# Patient Record
Sex: Male | Born: 1980 | Race: Black or African American | Hispanic: No | Marital: Married | Smoking: Former smoker
Health system: Southern US, Community
[De-identification: ages and names within clinical notes are randomized; demographics above are authoritative.]

## PROBLEM LIST (undated history)

## (undated) DIAGNOSIS — R51 Headache: Secondary | ICD-10-CM

## (undated) DIAGNOSIS — M109 Gout, unspecified: Secondary | ICD-10-CM

## (undated) DIAGNOSIS — K219 Gastro-esophageal reflux disease without esophagitis: Secondary | ICD-10-CM

## (undated) HISTORY — DX: Gastro-esophageal reflux disease without esophagitis: K21.9

## (undated) HISTORY — DX: Headache: R51

## (undated) HISTORY — DX: Gout, unspecified: M10.9

---

## 2011-11-29 ENCOUNTER — Encounter: Payer: Self-pay | Admitting: Family

## 2011-11-29 ENCOUNTER — Ambulatory Visit (INDEPENDENT_AMBULATORY_CARE_PROVIDER_SITE_OTHER): Payer: 59 | Admitting: Family

## 2011-11-29 DIAGNOSIS — R110 Nausea: Secondary | ICD-10-CM

## 2011-11-29 DIAGNOSIS — K219 Gastro-esophageal reflux disease without esophagitis: Secondary | ICD-10-CM

## 2011-11-29 DIAGNOSIS — S76119A Strain of unspecified quadriceps muscle, fascia and tendon, initial encounter: Secondary | ICD-10-CM

## 2011-11-29 DIAGNOSIS — S838X9A Sprain of other specified parts of unspecified knee, initial encounter: Secondary | ICD-10-CM

## 2011-11-29 LAB — CBC WITH DIFFERENTIAL/PLATELET
Basophils Absolute: 0 10*3/uL (ref 0.0–0.1)
Basophils Relative: 0.3 % (ref 0.0–3.0)
Eosinophils Absolute: 0.3 10*3/uL (ref 0.0–0.7)
Hemoglobin: 13.2 g/dL (ref 13.0–17.0)
Lymphocytes Relative: 25.1 % (ref 12.0–46.0)
MCHC: 32.3 g/dL (ref 30.0–36.0)
Monocytes Relative: 9.1 % (ref 3.0–12.0)
Neutro Abs: 5.6 10*3/uL (ref 1.4–7.7)
Neutrophils Relative %: 62.5 % (ref 43.0–77.0)
RBC: 4.48 Mil/uL (ref 4.22–5.81)
RDW: 13.7 % (ref 11.5–14.6)

## 2011-11-29 LAB — BASIC METABOLIC PANEL
Chloride: 106 mEq/L (ref 96–112)
GFR: 84.29 mL/min (ref >60.00)
Potassium: 4.7 mEq/L (ref 3.5–5.1)
Sodium: 141 mEq/L (ref 135–145)

## 2011-11-29 MED ORDER — DICLOFENAC SODIUM 75 MG PO TBEC: 75.0000 mg | DELAYED_RELEASE_TABLET | Freq: Two times a day (BID) | ORAL | Status: AC

## 2011-11-29 MED ORDER — CYCLOBENZAPRINE HCL 10 MG PO TABS: 10.0000 mg | ORAL_TABLET | Freq: Three times a day (TID) | ORAL | Status: DC | PRN

## 2011-11-29 NOTE — Progress Notes (Signed)
Subjective:    Patient ID: Alex Diaz, male    DOB: September 11, 1981, 31 y.o.   MRN: [id number]  HPI 31 year old [place] American male, new patient, nonsmoker exam to be established. Patient has a history of GERD that he typically controls with dietary changes. He is concerned that he typically develops nausea about 2 AM every night that last until he wakes up the next morning. He's been drinking water to help his symptoms and is unsure if it's really helping. He has not taken any medication for it. Has been off Toprol effect for several months. He denies any vomiting, diarrhea, constipation, blood in the stools or dark black stools.  Patient reports that he has recently began exercising more and believes that he has pulled a muscle in his left thigh. The pain has been present for about a week and a half, rates the pain a 9/10, worse with stretching. Describes it as a tight ache. Has been applying heat that he believes makes it worse. He has not tried any medication therapy. Denies any numbness or tingling.  Review of Systems  Constitutional: Negative.   HENT: Negative.   Eyes: Negative.   Respiratory: Negative.   Cardiovascular: Negative.   Gastrointestinal: Positive for nausea. Negative for vomiting, diarrhea, constipation and blood in stool.  Genitourinary: Negative.   Musculoskeletal: Positive for myalgias.       Left thigh  Skin: Negative.   Neurological: Negative.   Hematological: Negative.    Past Medical History  Diagnosis Date  . Headache   . GERD (gastroesophageal reflux disease)     History   Social History  . Marital Status: Single    Spouse Name: N/A    Number of Children: N/A  . Years of Education: N/A   Occupational History  . Not on file.   Social History Main Topics  . Smoking status: Former Games developer  . Smokeless tobacco: Not on file  . Alcohol Use: Yes     occassionally  . Drug Use: No  . Sexually Active:    Other Topics Concern  . Not on file    Social History Narrative  . No narrative on file    No past surgical history on file.  Family History  Problem Relation Age of Onset  . Hypertension Father   . Kidney disease Father   . Diabetes Paternal Aunt   . Cancer Maternal Grandmother     lung  . Diabetes Maternal Grandfather   . Heart disease Paternal Grandmother   . Stroke Paternal Grandmother     No Known Allergies  No current outpatient prescriptions on file prior to visit.    BP 122/82  Ht 5\' 7"  (1.702 m)  Wt 158 lb (71.668 kg)  BMI 24.75 kg/m2chart    Objective:   Physical Exam  Constitutional: He is oriented to person, place, and time. He appears well-developed and well-nourished.  Neck: Normal range of motion. Neck supple.  Cardiovascular: Normal rate, regular rhythm and normal heart sounds.   Pulmonary/Chest: Effort normal and breath sounds normal.  Abdominal: Soft. Bowel sounds are normal. There is no tenderness. There is no rebound.  Musculoskeletal: Normal range of motion.       Tenderness elicited to palpation of the left medial quadricep. Pain elicited with stretching exercises particularly abduction and abduction.  Neurological: He is alert and oriented to person, place, and time.  Skin: Skin is warm and dry.  Psychiatric: He has a normal mood and affect.  Assessment & Plan:   assessment: GERD, nausea, quadricep muscle strain  Plan: Flexeril 10 mg 3 times a day. Warned of drowsiness. Voltaren 75 mg one tablet twice daily with food. Resume Prilosec OTC once daily to help combat nausea probably the underlying problem is GERD, uncontrolled. Lab sent to include BMP, CBC and H. pylori. The patient pending results. Complete physical exam bid next mild patient to schedule.

## 2011-11-29 NOTE — Patient Instructions (Signed)
Muscle Strain A muscle strain, or pulled muscle, occurs when a muscle is over-stretched. A small number of muscle fibers may also be torn. This is especially common in athletes. This happens when a sudden violent force placed on a muscle pushes it past its capacity. Usually, recovery from a pulled muscle takes 1 to 2 weeks. But complete healing will take 5 to 6 weeks. There are millions of muscle fibers. Following injury, your body will usually return to normal quickly. HOME CARE INSTRUCTIONS   While awake, apply ice to the sore muscle for 15 to 20 minutes each hour for the first 2 days. Put ice in a plastic bag and place a towel between the bag of ice and your skin.   Do not use the pulled muscle for several days. Do not use the muscle if you have pain.   You may wrap the injured area with an elastic bandage for comfort. Be careful not to bind it too tightly. This may interfere with blood circulation.   Only take over-the-counter or prescription medicines for pain, discomfort, or fever as directed by your caregiver. Do not use aspirin as this will increase bleeding (bruising) at injury site.   Warming up before exercise helps prevent muscle strains.  SEEK MEDICAL CARE IF:  There is increased pain or swelling in the affected area. MAKE SURE YOU:   Understand these instructions.   Will watch your condition.   Will get help right away if you are not doing well or get worse.  Document Released: 09/24/2005 Document Revised: 06/06/2011 Document Reviewed: 04/23/2007 [employer] Patient Information 2012 [employer]. 

## 2012-01-12 ENCOUNTER — Other Ambulatory Visit: Payer: Self-pay | Admitting: Family Medicine

## 2012-02-25 ENCOUNTER — Other Ambulatory Visit: Payer: Self-pay

## 2012-02-25 MED ORDER — CYCLOBENZAPRINE HCL 10 MG PO TABS: 10.0000 mg | ORAL_TABLET | Freq: Three times a day (TID) | ORAL | Status: DC | PRN

## 2012-02-25 NOTE — Telephone Encounter (Signed)
Last OV 11/29/2011. Last fill date 01/14/12

## 2012-04-22 ENCOUNTER — Other Ambulatory Visit: Payer: 59

## 2012-04-22 ENCOUNTER — Other Ambulatory Visit (INDEPENDENT_AMBULATORY_CARE_PROVIDER_SITE_OTHER): Payer: 59

## 2012-04-22 DIAGNOSIS — Z0000 Encounter for general adult medical examination without abnormal findings: Secondary | ICD-10-CM

## 2012-04-22 LAB — CBC WITH DIFFERENTIAL/PLATELET
Basophils Relative: 0.4 % (ref 0.0–3.0)
Eosinophils Absolute: 0.8 10*3/uL — ABNORMAL HIGH (ref 0.0–0.7)
HCT: 42.8 % (ref 39.0–52.0)
Hemoglobin: 13.9 g/dL (ref 13.0–17.0)
Lymphocytes Relative: 23.8 % (ref 12.0–46.0)
Lymphs Abs: 2.3 10*3/uL (ref 0.7–4.0)
MCHC: 32.4 g/dL (ref 30.0–36.0)
MCV: 92.4 fl (ref 78.0–100.0)
Monocytes Absolute: 0.9 10*3/uL (ref 0.1–1.0)
Neutro Abs: 5.7 10*3/uL (ref 1.4–7.7)
RBC: 4.64 Mil/uL (ref 4.22–5.81)
RDW: 13.9 % (ref 11.5–14.6)

## 2012-04-22 LAB — POCT URINALYSIS DIPSTICK
Bilirubin, UA: NEGATIVE
Blood, UA: NEGATIVE
Glucose, UA: NEGATIVE
Spec Grav, UA: 1.02
Urobilinogen, UA: 0.2
pH, UA: 6.5

## 2012-04-23 LAB — HEPATIC FUNCTION PANEL
Albumin: 4.5 g/dL (ref 3.5–5.2)
Alkaline Phosphatase: 66 U/L (ref 39–117)
Total Protein: 8.2 g/dL (ref 6.0–8.3)

## 2012-04-23 LAB — LIPID PANEL
Cholesterol: 217 mg/dL — ABNORMAL HIGH (ref 0–200)
HDL: 57.9 mg/dL (ref >39.00)

## 2012-04-23 LAB — BASIC METABOLIC PANEL
CO2: 28 mEq/L (ref 19–32)
Chloride: 104 mEq/L (ref 96–112)
Glucose, Bld: 103 mg/dL — ABNORMAL HIGH (ref 70–99)
Sodium: 139 mEq/L (ref 135–145)

## 2012-04-29 ENCOUNTER — Ambulatory Visit (INDEPENDENT_AMBULATORY_CARE_PROVIDER_SITE_OTHER): Payer: 59 | Admitting: Family

## 2012-04-29 ENCOUNTER — Encounter: Payer: Self-pay | Admitting: Family

## 2012-04-29 VITALS — BP 120/80 | HR 102 | Temp 98.3°F | Wt 160.0 lb

## 2012-04-29 DIAGNOSIS — Z0000 Encounter for general adult medical examination without abnormal findings: Secondary | ICD-10-CM

## 2012-04-29 DIAGNOSIS — E7800 Pure hypercholesterolemia, unspecified: Secondary | ICD-10-CM

## 2012-04-29 NOTE — Progress Notes (Signed)
Subjective:    Patient ID: Alex Diaz, male    DOB: 03-18-1981, 31 y.o.   MRN: [id number]  HPI  Patient presents for yearly preventative medicine examination. All immunizations and health maintenance protocols were reviewed with the patient and they are up to date with these protocols. Screening laboratory values were reviewed with the patient including screening of hyperlipidemia renal function and hepatic function.There medications past medical history social history problem list and allergies were reviewed in detail.    Review of Systems  Constitutional: Negative.   HENT: Negative.   Eyes: Negative.   Respiratory: Negative.   Cardiovascular: Negative.   Gastrointestinal: Negative.   Genitourinary: Negative.   Musculoskeletal: Negative.   Skin: Negative.   Neurological: Negative.   Hematological: Negative.   Psychiatric/Behavioral: Negative.    Past Medical History  Diagnosis Date  . Headache   . GERD (gastroesophageal reflux disease)     History   Social History  . Marital Status: Single    Spouse Name: N/A    Number of Children: N/A  . Years of Education: N/A   Occupational History  . Not on file.   Social History Main Topics  . Smoking status: Former Games developer  . Smokeless tobacco: Not on file  . Alcohol Use: Yes     occassionally  . Drug Use: No  . Sexually Active:    Other Topics Concern  . Not on file   Social History Narrative  . No narrative on file    No past surgical history on file.  Family History  Problem Relation Age of Onset  . Hypertension Father   . Kidney disease Father   . Diabetes Paternal Aunt   . Cancer Maternal Grandmother     lung  . Diabetes Maternal Grandfather   . Heart disease Paternal Grandmother   . Stroke Paternal Grandmother     No Known Allergies  Current Outpatient Prescriptions on File Prior to Visit  Medication Sig Dispense Refill  . cyclobenzaprine (FLEXERIL) 10 MG tablet Take 1 tablet (10 mg total)  by mouth 3 (three) times daily as needed for muscle spasms.  30 tablet  0  . diclofenac (VOLTAREN) 75 MG EC tablet Take 1 tablet (75 mg total) by mouth 2 (two) times daily with a meal.  60 tablet  2    BP 120/80  Pulse 102  Temp 98.3 F (36.8 C) (Oral)  Wt 160 lb (72.576 kg)  SpO2 98%chart    Objective:   Physical Exam  Constitutional: He is oriented to person, place, and time. He appears well-developed and well-nourished.  HENT:  Head: Normocephalic and atraumatic.  Right Ear: External ear normal.  Left Ear: External ear normal.  Nose: Nose normal.  Mouth/Throat: Oropharynx is clear and moist.  Eyes: Conjunctivae and EOM are normal. Pupils are equal, round, and reactive to light.  Neck: Normal range of motion. Neck supple.  Cardiovascular: Normal rate, regular rhythm, normal heart sounds and intact distal pulses.   Pulmonary/Chest: Effort normal and breath sounds normal.  Abdominal: Soft. Bowel sounds are normal.  Genitourinary: Penis normal.  Musculoskeletal: Normal range of motion.  Neurological: He is alert and oriented to person, place, and time. He has normal reflexes.  Skin: Skin is warm and dry.  Psychiatric: He has a normal mood and affect.          Assessment & Plan:  Assessment: CPX, Hypercholesterolemia  Plan: Anticipatory Guidance appropriate for age to include safe sex practices, avoidance of drugs,  alcohol, and tobacco. Healthy diet and exercise. Testicular exams. Call the office with any questions or concerns. Recheck in 1 year.

## 2012-04-29 NOTE — Patient Instructions (Addendum)

## 2012-05-07 ENCOUNTER — Ambulatory Visit (INDEPENDENT_AMBULATORY_CARE_PROVIDER_SITE_OTHER): Payer: 59 | Admitting: Family Medicine

## 2012-05-07 VITALS — BP 104/64 | HR 68 | Temp 98.0°F | Resp 16 | Ht 67.5 in | Wt 157.0 lb

## 2012-05-07 DIAGNOSIS — Z202 Contact with and (suspected) exposure to infections with a predominantly sexual mode of transmission: Secondary | ICD-10-CM

## 2012-05-07 DIAGNOSIS — Z2089 Contact with and (suspected) exposure to other communicable diseases: Secondary | ICD-10-CM

## 2012-05-07 MED ORDER — AZITHROMYCIN 250 MG PO TABS: ORAL_TABLET | ORAL | Status: AC

## 2012-05-07 NOTE — Progress Notes (Signed)
[facility] [address] [place] [address] [phone]  Date:  05/07/2012   Name:  Alex Diaz   DOB:  1981-02-01   MRN:  [id number]  PCP:  Janell Quiet, FNP    Chief Complaint: Exposure to STD   History of Present Illness:  Alex Diaz is a 31 y.o. very pleasant male patient who presents with the following:  Here because his GF was diagnosed with chlamydia- it seems that her test was done a while ago, but she just received the results.  He does not have any discharge. His GF does not have gonorrhea.    Thayer Ohm is otherwise heathy and has has not other concerns today. He would like to have complete STI testing today as well.    There is no problem list on file for this patient.   Past Medical History  Diagnosis Date  . Headache   . GERD (gastroesophageal reflux disease)     No past surgical history on file.  History  Substance Use Topics  . Smoking status: Former Games developer  . Smokeless tobacco: Not on file  . Alcohol Use: Yes     occassionally    Family History  Problem Relation Age of Onset  . Hypertension Father   . Kidney disease Father   . Diabetes Paternal Aunt   . Cancer Maternal Grandmother     lung  . Diabetes Maternal Grandfather   . Heart disease Paternal Grandmother   . Stroke Paternal Grandmother     No Known Allergies  Medication list has been reviewed and updated.  Current Outpatient Prescriptions on File Prior to Visit  Medication Sig Dispense Refill  . cyclobenzaprine (FLEXERIL) 10 MG tablet Take 1 tablet (10 mg total) by mouth 3 (three) times daily as needed for muscle spasms.  30 tablet  0  . diclofenac (VOLTAREN) 75 MG EC tablet Take 1 tablet (75 mg total) by mouth 2 (two) times daily with a meal.  60 tablet  2    Review of Systems:  As per HPI- otherwise negative.   Physical Examination: Filed Vitals:   05/07/12 1242  BP: 104/64  Pulse: 68  Temp: 98 F (36.7 C)  Resp: 16    Filed Vitals:   05/07/12 1242  Height: 5' 7.5" (1.715 m)  Weight: 157 lb (71.215 kg)   Body mass index is 24.23 kg/(m^2). Ideal Body Weight: Weight in (lb) to have BMI = 25: 161.7   GEN: WDWN, NAD, Non-toxic, A & O x 3 HEENT: Atraumatic, Normocephalic. Neck supple. No masses, No LAD.  PEERL, EOMI, oropharynx wnl Ears and Nose: No external deformity. CV: RRR, No M/G/R. No JVD. No thrill. No extra heart sounds. PULM: CTA B, no wheezes, crackles, rhonchi. No retractions. No resp. distress. No accessory muscle use. GU: normal genitals, no discharge EXTR: No c/c/e NEURO Normal gait.  PSYCH: Normally interactive. Conversant. Not depressed or anxious appearing.  Calm demeanor.    Assessment and Plan: 1. Exposure to STD  GC/chlamydia probe amp, urine, Hepatitis B surface antibody, Hepatitis B surface antigen, Hepatitis C antibody, HIV antibody, HSV(herpes simplex vrs) 1+2 ab-IgG, RPR, azithromycin (ZITHROMAX) 250 MG tablet   Will go ahead and treat for chlamydia.  Will call with his lab results.  He will abstain from sex until both partners have been treated for 10 days  Abbe Amsterdam, MD

## 2012-05-08 LAB — GC/CHLAMYDIA PROBE AMP, URINE
Chlamydia, Swab/Urine, PCR: POSITIVE — AB
GC Probe Amp, Urine: NEGATIVE

## 2012-05-08 LAB — HEPATITIS C ANTIBODY: HCV Ab: NEGATIVE

## 2012-05-08 LAB — HEPATITIS B SURFACE ANTIGEN: Hepatitis B Surface Ag: NEGATIVE

## 2012-05-10 ENCOUNTER — Encounter: Payer: Self-pay | Admitting: Family Medicine

## 2012-12-13 ENCOUNTER — Ambulatory Visit (INDEPENDENT_AMBULATORY_CARE_PROVIDER_SITE_OTHER): Payer: 59 | Admitting: Family Medicine

## 2012-12-13 VITALS — BP 110/80 | HR 77 | Temp 98.0°F | Resp 18 | Ht 68.0 in | Wt 161.0 lb

## 2012-12-13 DIAGNOSIS — K219 Gastro-esophageal reflux disease without esophagitis: Secondary | ICD-10-CM

## 2012-12-13 DIAGNOSIS — Z0000 Encounter for general adult medical examination without abnormal findings: Secondary | ICD-10-CM

## 2012-12-13 MED ORDER — RANITIDINE HCL 150 MG PO TABS: 150.0000 mg | ORAL_TABLET | Freq: Two times a day (BID) | ORAL | Status: DC

## 2012-12-13 NOTE — Patient Instructions (Addendum)
Diet for Gastroesophageal Reflux Disease, Adult Reflux (acid reflux) is when acid from your stomach flows up into the esophagus. When acid comes in contact with the esophagus, the acid causes irritation and soreness (inflammation) in the esophagus. When reflux happens often or so severely that it causes damage to the esophagus, it is called gastroesophageal reflux disease (GERD). Nutrition therapy can help ease the discomfort of GERD. FOODS OR DRINKS TO AVOID OR LIMIT  Smoking or chewing tobacco. Nicotine is one of the most potent stimulants to acid production in the gastrointestinal tract.  Caffeinated and decaffeinated coffee and black tea.  Regular or low-calorie carbonated beverages or energy drinks (caffeine-free carbonated beverages are allowed).   Strong spices, such as black pepper, white pepper, red pepper, cayenne, curry powder, and chili powder.  Peppermint or spearmint.  Chocolate.  High-fat foods, including meats and fried foods. Extra added fats including oils, butter, salad dressings, and nuts. Limit these to less than 8 tsp per day.  Fruits and vegetables if they are not tolerated, such as citrus fruits or tomatoes.  Alcohol.  Any food that seems to aggravate your condition. If you have questions regarding your diet, call your caregiver or a registered dietitian. OTHER THINGS THAT MAY HELP GERD INCLUDE:   Eating your meals slowly, in a relaxed setting.  Eating 5 to 6 small meals per day instead of 3 large meals.  Eliminating food for a period of time if it causes distress.  Not lying down until 3 hours after eating a meal.  Keeping the head of your bed raised 6 to 9 inches (15 to 23 cm) by using a foam wedge or blocks under the legs of the bed. Lying flat may make symptoms worse.  Being physically active. Weight loss may be helpful in reducing reflux in overweight or obese adults.  Wear loose fitting clothing EXAMPLE MEAL PLAN This meal plan is approximately  2,000 calories based on https://www.bernard.org/ meal planning guidelines. Breakfast   cup cooked oatmeal.  1 cup strawberries.  1 cup low-fat milk.  1 oz almonds. Snack  1 cup cucumber slices.  6 oz yogurt (made from low-fat or fat-free milk). Lunch  2 slice whole-wheat bread.  2 oz sliced [place].  2 tsp mayonnaise.  1 cup blueberries.  1 cup snap peas. Snack  6 whole-wheat crackers.  1 oz string cheese. Dinner   cup brown rice.  1 cup mixed veggies.  1 tsp olive oil.  3 oz grilled fish. Document Released: 09/24/2005 Document Revised: 12/17/2011 Document Reviewed: 08/10/2011 [facility] Patient Information 2013 [place], [place].  Health Maintenance, Males A healthy lifestyle and preventative care can promote health and wellness.  Maintain regular health, dental, and eye exams.  Eat a healthy diet. Foods like vegetables, fruits, whole grains, low-fat dairy products, and lean protein foods contain the nutrients you need without too many calories. Decrease your intake of foods high in solid fats, added sugars, and salt. Get information about a proper diet from your caregiver, if necessary.  Regular physical exercise is one of the most important things you can do for your health. Most adults should get at least 150 minutes of moderate-intensity exercise (any activity that increases your heart rate and causes you to sweat) each week. In addition, most adults need muscle-strengthening exercises on 2 or more days a week.   Maintain a healthy weight. The body mass index (BMI) is a screening tool to identify possible weight problems. It provides an estimate of body fat based on  height and weight. Your caregiver can help determine your BMI, and can help you achieve or maintain a healthy weight. For adults 20 years and older:  A BMI below 18.5 is considered underweight.  A BMI of 18.5 to 24.9 is normal.  A BMI of 25 to 29.9 is considered overweight.  A BMI of 30 and above  is considered obese.  Maintain normal blood lipids and cholesterol by exercising and minimizing your intake of saturated fat. Eat a balanced diet with plenty of fruits and vegetables. Blood tests for lipids and cholesterol should begin at age 49 and be repeated every 5 years. If your lipid or cholesterol levels are high, you are over 50, or you are a high risk for heart disease, you may need your cholesterol levels checked more frequently.Ongoing high lipid and cholesterol levels should be treated with medicines, if diet and exercise are not effective.  If you smoke, find out from your caregiver how to quit. If you do not use tobacco, do not start.  If you choose to drink alcohol, do not exceed 2 drinks per day. One drink is considered to be 12 ounces (355 mL) of beer, 5 ounces (148 mL) of wine, or 1.5 ounces (44 mL) of liquor.  Avoid use of street drugs. Do not share needles with anyone. Ask for help if you need support or instructions about stopping the use of drugs.  High blood pressure causes heart disease and increases the risk of stroke. Blood pressure should be checked at least every 1 to 2 years. Ongoing high blood pressure should be treated with medicines if weight loss and exercise are not effective.  If you are 81 to 30 years old, ask your caregiver if you should take aspirin to prevent heart disease.  Diabetes screening involves taking a blood sample to check your fasting blood sugar level. This should be done once every 3 years, after age 50, if you are within normal weight and without risk factors for diabetes. Testing should be considered at a younger age or be carried out more frequently if you are overweight and have at least 1 risk factor for diabetes.  Colorectal cancer can be detected and often prevented. Most routine colorectal cancer screening begins at the age of 49 and continues through age 75. However, your caregiver may recommend screening at an earlier age if you have risk  factors for colon cancer. On a yearly basis, your caregiver may provide home test kits to check for hidden blood in the stool. Use of a small camera at the end of a tube, to directly examine the colon (sigmoidoscopy or colonoscopy), can detect the earliest forms of colorectal cancer. Talk to your caregiver about this at age 54, when routine screening begins. Direct examination of the colon should be repeated every 5 to 10 years through age 26, unless early forms of pre-cancerous polyps or small growths are found.  Hepatitis C blood testing is recommended for all people born from [birth year] through 1963 and any individual with known risks for hepatitis C.  Healthy men should no longer receive prostate-specific antigen (PSA) blood tests as part of routine cancer screening. Consult with your caregiver about prostate cancer screening.  Testicular cancer screening is not recommended for adolescents or adult males who have no symptoms. Screening includes self-exam, caregiver exam, and other screening tests. Consult with your caregiver about any symptoms you have or any concerns you have about testicular cancer.  Practice safe sex. Use condoms and avoid high-risk  sexual practices to reduce the spread of sexually transmitted infections (STIs).  Use sunscreen with a sun protection factor (SPF) of 30 or greater. Apply sunscreen liberally and repeatedly throughout the day. You should seek shade when your shadow is shorter than you. Protect yourself by wearing long sleeves, pants, a wide-brimmed hat, and sunglasses year round, whenever you are outdoors.  Notify your caregiver of new moles or changes in moles, especially if there is a change in shape or color. Also notify your caregiver if a mole is larger than the size of a pencil eraser.  A one-time screening for abdominal aortic aneurysm (AAA) and surgical repair of large AAAs by sound wave imaging (ultrasonography) is recommended for ages 31 to 24 years who are  current or former smokers.  Stay current with your immunizations. Document Released: 03/22/2008 Document Revised: 12/17/2011 Document Reviewed: 02/19/2011 [facility] Patient Information 2013 [place], [place].

## 2012-12-13 NOTE — Progress Notes (Signed)
Subjective:    Patient ID: Alex Diaz, male    DOB: [birth year], 32 y.o.   MRN: [id number]  HPI  Pt is completely fasting.  No complaints or concerns.  Past Medical History  Diagnosis Date  . Headache   . GERD (gastroesophageal reflux disease)    No current outpatient prescriptions on file prior to visit.   No current facility-administered medications on file prior to visit.   No Known Allergies History reviewed. No pertinent past surgical history. Family History  Problem Relation Age of Onset  . Hypertension Father   . Kidney disease Father   . Diabetes Paternal Aunt   . Cancer Maternal Grandmother     lung  . Diabetes Maternal Grandfather   . Heart disease Paternal Grandmother   . Stroke Paternal Grandmother    History   Social History  . Marital Status: Single    Spouse Name: N/A    Number of Children: N/A  . Years of Education: N/A   Social History Main Topics  . Smoking status: Former Games developer  . Smokeless tobacco: None  . Alcohol Use: Yes     Comment: occassionally  . Drug Use: No  . Sexually Active: Yes   Other Topics Concern  . None   Social History Narrative  . None     Review of Systems  Constitutional: Negative for fever, chills, activity change, appetite change and fatigue.  HENT: Negative for ear pain, congestion and sore throat.   Eyes: Negative for visual disturbance.  Respiratory: Negative for cough and shortness of breath.   Cardiovascular: Negative for chest pain and leg swelling.  Gastrointestinal: Negative for nausea, vomiting, abdominal pain, diarrhea and constipation.  Genitourinary: Negative for dysuria.  Musculoskeletal: Negative for myalgias, arthralgias and gait problem.  Skin: Negative for rash.  Neurological: Negative for dizziness, weakness, numbness and headaches.  Psychiatric/Behavioral: Negative for sleep disturbance.  All other systems reviewed and are negative.      BP 110/80  Pulse 77  Temp(Src) 98 F (36.7  C) (Oral)  Resp 18  Ht 5\' 8"  (1.727 m)  Wt 161 lb (73.029 kg)  BMI 24.49 kg/m2 Objective:   Physical Exam  Constitutional: He is oriented to person, place, and time. He appears well-developed and well-nourished. No distress.  HENT:  Head: Normocephalic and atraumatic.  Right Ear: Tympanic membrane, external ear and ear canal normal.  Left Ear: Tympanic membrane, external ear and ear canal normal.  Nose: Nose normal.  Mouth/Throat: Uvula is midline, oropharynx is clear and moist and mucous membranes are normal. No oropharyngeal exudate.  Eyes: Conjunctivae are normal. Right eye exhibits no discharge. Left eye exhibits no discharge. No scleral icterus.  Neck: Normal range of motion. Neck supple. No thyromegaly present.  Cardiovascular: Normal rate, regular rhythm, normal heart sounds and intact distal pulses.   Pulmonary/Chest: Effort normal and breath sounds normal. No respiratory distress.  Abdominal: Soft. Bowel sounds are normal. He exhibits no distension and no mass. There is no tenderness. There is no rebound and no guarding.  Musculoskeletal: He exhibits no edema.  Lymphadenopathy:    He has no cervical adenopathy.  Neurological: He is alert and oriented to person, place, and time. He has normal reflexes. No cranial nerve deficit. He exhibits normal muscle tone.  Skin: Skin is warm and dry. No rash noted. He is not diaphoretic. No erythema.  Psychiatric: He has a normal mood and affect. His behavior is normal.  Assessment & Plan:  Routine general medical examination at a health care facility - had normal screening labs 8 mos ago, no need to repeat at this time.  GERD (gastroesophageal reflux disease) - try going off of ppi.  Meds ordered this encounter  Medications  . ranitidine (ZANTAC) 150 MG tablet    Sig: Take 1 tablet (150 mg total) by mouth 2 (two) times daily.    Dispense:  180 tablet    Refill:  3

## 2013-04-28 ENCOUNTER — Telehealth: Payer: Self-pay

## 2013-04-28 NOTE — Telephone Encounter (Signed)
Pt states that he really needs the form complete by tomorrow! [phone]

## 2013-04-28 NOTE — Telephone Encounter (Signed)
Pt dropped off a [employer] form to be filled out w/info from Dr Alver Fisher 12/13/12 OV. I have put the form in Dr Alver Fisher box.

## 2013-04-28 NOTE — Telephone Encounter (Signed)
Form signed and ready for pickup.

## 2013-04-30 NOTE — Telephone Encounter (Signed)
Left a message for patient to pick up form at the front desk.

## 2013-09-17 ENCOUNTER — Ambulatory Visit (INDEPENDENT_AMBULATORY_CARE_PROVIDER_SITE_OTHER): Payer: 59 | Admitting: Internal Medicine

## 2013-09-17 VITALS — BP 98/68 | HR 86 | Temp 98.8°F | Resp 16 | Ht 67.0 in | Wt 154.4 lb

## 2013-09-17 DIAGNOSIS — R05 Cough: Secondary | ICD-10-CM

## 2013-09-17 DIAGNOSIS — R6883 Chills (without fever): Secondary | ICD-10-CM

## 2013-09-17 DIAGNOSIS — R52 Pain, unspecified: Secondary | ICD-10-CM

## 2013-09-17 DIAGNOSIS — R059 Cough, unspecified: Secondary | ICD-10-CM

## 2013-09-17 DIAGNOSIS — R509 Fever, unspecified: Secondary | ICD-10-CM

## 2013-09-17 LAB — POCT INFLUENZA A/B: Influenza A, POC: NEGATIVE

## 2013-09-17 MED ORDER — HYDROCODONE-ACETAMINOPHEN 7.5-325 MG/15ML PO SOLN
5.0000 mL | Freq: Four times a day (QID) | ORAL | Status: DC | PRN
Start: 2013-09-17 — End: 2014-04-02

## 2013-09-17 MED ORDER — AZITHROMYCIN 250 MG PO TABS: ORAL_TABLET | ORAL | Status: DC

## 2013-09-17 NOTE — Patient Instructions (Signed)

## 2013-09-17 NOTE — Progress Notes (Signed)
   Subjective:    Patient ID: Alex Diaz, male    DOB: 1981-01-17, 32 y.o.   MRN: [id number]  HPI Patient presents with nosebleeds, dry cough, headaches, loss of appetite, body aches.  Patient thinks he has the flu.  Symptoms since Sunday. Fatigue, no sob, cp. 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  Review of Systems     Objective:   Physical Exam  Vitals reviewed. Constitutional: He is oriented to person, place, and time. He appears well-developed and well-nourished. No distress.  HENT:  Right Ear: External ear normal.  Left Ear: External ear normal.  Nose: Mucosal edema, rhinorrhea and sinus tenderness present.  Mouth/Throat: No uvula swelling. Posterior oropharyngeal erythema present.  Eyes: Conjunctivae and EOM are normal. Pupils are equal, round, and reactive to light.  Neck: Normal range of motion. Neck supple.  Cardiovascular: Normal rate, regular rhythm and normal heart sounds.   Pulmonary/Chest: Effort normal and breath sounds normal. He has no wheezes. He exhibits no tenderness.  Neurological: He is alert and oriented to person, place, and time. No cranial nerve deficit. He exhibits normal muscle tone. Coordination normal.  Psychiatric: He has a normal mood and affect. His behavior is normal.    Results for orders placed in visit on 09/17/13  POCT INFLUENZA A/B      Result Value Range   Influenza A, POC Negative     Influenza B, POC Negative           Assessment & Plan:  Fever/cough/body aches Zpak/Lortab

## 2013-09-17 NOTE — Progress Notes (Signed)
   Subjective:    Patient ID: Alex Diaz, male    DOB: Sep 12, 1981, 32 y.o.   MRN: [id number]  HPI    Review of Systems     Objective:   Physical Exam        Assessment & Plan:

## 2013-11-07 ENCOUNTER — Ambulatory Visit (INDEPENDENT_AMBULATORY_CARE_PROVIDER_SITE_OTHER): Payer: 59 | Admitting: Family Medicine

## 2013-11-07 VITALS — BP 120/72 | HR 54 | Temp 98.2°F | Resp 18 | Ht 67.5 in | Wt 159.0 lb

## 2013-11-07 DIAGNOSIS — Z0000 Encounter for general adult medical examination without abnormal findings: Secondary | ICD-10-CM

## 2013-11-07 DIAGNOSIS — H6120 Impacted cerumen, unspecified ear: Secondary | ICD-10-CM

## 2013-11-07 DIAGNOSIS — Z23 Encounter for immunization: Secondary | ICD-10-CM

## 2013-11-07 LAB — LIPID PANEL
Cholesterol: 186 mg/dL (ref 0–200)
HDL: 56 mg/dL (ref >39)
LDL Cholesterol: 118 mg/dL — ABNORMAL HIGH (ref 0–99)
Total CHOL/HDL Ratio: 3.3 Ratio
Triglycerides: 60 mg/dL (ref <150)
VLDL: 12 mg/dL (ref 0–40)

## 2013-11-07 LAB — POCT URINALYSIS DIPSTICK
Bilirubin, UA: NEGATIVE
Blood, UA: NEGATIVE
Glucose, UA: NEGATIVE
Ketones, UA: NEGATIVE
Leukocytes, UA: NEGATIVE
Nitrite, UA: NEGATIVE
Protein, UA: NEGATIVE
Spec Grav, UA: 1.015
Urobilinogen, UA: 0.2
pH, UA: 6.5

## 2013-11-07 LAB — POCT CBC
Granulocyte percent: 60.6 %G (ref 37–80)
HCT, POC: 42.9 % — AB (ref 43.5–53.7)
Hemoglobin: 12.8 g/dL — AB (ref 14.1–18.1)
Lymph, poc: 2.3 (ref 0.6–3.4)
MCH, POC: 28.7 pg (ref 27–31.2)
MCHC: 29.8 g/dL — AB (ref 31.8–35.4)
MCV: 96.3 fL (ref 80–97)
MID (cbc): 0.5 (ref 0–0.9)
MPV: 12.4 fL (ref 0–99.8)
POC Granulocyte: 4.2 (ref 2–6.9)
POC LYMPH PERCENT: 32.7 %L (ref 10–50)
POC MID %: 6.7 %M (ref 0–12)
Platelet Count, POC: 121 10*3/uL — AB (ref 142–424)
RBC: 4.46 M/uL — AB (ref 4.69–6.13)
RDW, POC: 14.1 %
WBC: 6.9 10*3/uL (ref 4.6–10.2)

## 2013-11-07 NOTE — Progress Notes (Signed)
Patient ID: Alex Diaz MRN: [id number], DOB: 06-Oct-1981 32 y.o. Date of Encounter: 11/07/2013, 12:23 PM  Primary Physician: Janell Quiet, FNP  Chief Complaint: Physical (CPE)  HPI: 33 y.o. y/o male with history noted below here for CPE.  Doing well. He works in the [employer] and is applying for job in Editor, commissioning. He is married and has a 31-month-old and a 18-year-old.  He believes his last tetanus shot was 3 years ago  Patient continues to stay in good shape by exercising: Lipitor machine, weights, and running. He did referrals in high school. No issues/complaints.  Review of Systems: Consitutional: No fever, chills, fatigue, night sweats, lymphadenopathy, or weight changes. Eyes: No visual changes, eye redness, or discharge. ENT/Mouth: Ears: No otalgia, tinnitus, hearing loss, discharge. Nose: No congestion, rhinorrhea, sinus pain, or epistaxis. Throat: No sore throat, post nasal drip, or teeth pain. Cardiovascular: No CP, palpitations, diaphoresis, DOE, edema, orthopnea, PND. Respiratory: No cough, hemoptysis, SOB, or wheezing. Gastrointestinal: No anorexia, dysphagia, reflux, pain, nausea, vomiting, hematemesis, diarrhea, constipation, BRBPR, or melena. Genitourinary: No dysuria, frequency, urgency, hematuria, incontinence, nocturia, decreased urinary stream, discharge, impotence, or testicular pain/masses. Musculoskeletal: No decreased ROM, myalgias, stiffness, joint swelling, or weakness. Skin: No rash, erythema, lesion changes, pain, warmth, jaundice, or pruritis. Neurological: No headache, dizziness, syncope, seizures, tremors, memory loss, coordination problems, or paresthesias. Psychological: No anxiety, depression, hallucinations, SI/HI. Endocrine: No fatigue, polydipsia, polyphagia, polyuria, or known diabetes. All other systems were reviewed and are otherwise negative.  Past Medical History  Diagnosis Date  . Headache(784.0)   . GERD  (gastroesophageal reflux disease)      History reviewed. No pertinent past surgical history.  Home Meds:  Prior to Admission medications   Medication Sig Start Date End Date Taking? Authorizing Provider  azithromycin (ZITHROMAX) 250 MG tablet Use as directed 09/17/13   Jonita Albee, MD  HYDROcodone-acetaminophen (HYCET) 7.5-325 mg/15 ml solution Take 5 mLs by mouth every 6 (six) hours as needed (or cough). 09/17/13   Jonita Albee, MD    Allergies: No Known Allergies  History   Social History  . Marital Status: Married    Spouse Name: N/A    Number of Children: N/A  . Years of Education: N/A   Occupational History  . Not on file.   Social History Main Topics  . Smoking status: Former Games developer  . Smokeless tobacco: Not on file  . Alcohol Use: Yes     Comment: occassionally  . Drug Use: No  . Sexual Activity: Yes   Other Topics Concern  . Not on file   Social History Narrative  . No narrative on file    Family History  Problem Relation Age of Onset  . Hypertension Father   . Kidney disease Father   . Diabetes Paternal Aunt   . Cancer Maternal Grandmother     lung  . Diabetes Maternal Grandfather   . Heart disease Paternal Grandmother   . Stroke Paternal Grandmother     Physical Exam: Blood pressure 120/72, pulse 54, temperature 98.2 F (36.8 C), temperature source Oral, resp. rate 18, height 5' 7.5" (1.715 m), weight 159 lb (72.122 kg), SpO2 97.00%.  General: Well developed, well nourished, in no acute distress. HEENT: Normocephalic, atraumatic. Conjunctiva pink, sclera non-icteric. Pupils 2 mm constricting to 1 mm, round, regular, and equally reactive to light and accomodation. EOMI. Internal auditory canal clear. TMs with good cone of light and without pathology. Nasal mucosa pink. Nares are without discharge. No  sinus tenderness. Oral mucosa pink. Dentition excellen. Pharynx without exudate.   Neck: Supple. Trachea midline. No thyromegaly. Full ROM. No  lymphadenopathy. Lungs: Clear to auscultation bilaterally without wheezes, rales, or rhonchi. Breathing is of normal effort and unlabored. Cardiovascular: RRR with S1 S2. No murmurs, rubs, or gallops appreciated. Distal pulses 2+ symmetrically. No carotid or abdominal bruits Abdomen: Soft, non-tender, non-distended with normoactive bowel sounds. No hepatosplenomegaly or masses. No rebound/guarding. No CVA tenderness. Without hernias.   Genitourinary:  circumcised male. No penile lesions. Testes descended bilaterally, and smooth without tenderness or masses.  Musculoskeletal: Full range of motion and 5/5 strength throughout. Without swelling, atrophy, tenderness, crepitus, or warmth. Extremities without clubbing, cyanosis, or edema. Calves supple. Skin: Warm and moist without erythema, ecchymosis, wounds, or rash. Neuro: A+Ox3. CN II-XII grossly intact. Moves all extremities spontaneously. Full sensation throughout. Normal gait. DTR 2+ throughout upper and lower extremities. Finger to nose intact. Psych:  Responds to questions appropriately with a normal affect.   Studies: CBC,  Lipid,  UA:   Assessment/Plan:  33 y.o. y/o  male here for CPE Excellent condition -Routine general medical examination at a health care facility - Plan: POCT CBC, POCT urinalysis dipstick, Lipid panel  Cerumen impaction  Signed, Elvina SidleKurt Deltha Bernales, MD    Signed, Elvina SidleKurt Ruthene Methvin, MD 11/07/2013 12:23 PM

## 2013-11-07 NOTE — Patient Instructions (Signed)

## 2014-03-02 ENCOUNTER — Telehealth: Payer: Self-pay

## 2014-03-02 NOTE — Telephone Encounter (Signed)
Left message for patient regarding his request for records. We need to know who we are sending these records to. Patient listed a fax number but did not specify who was receiving those records. Asked him to please return our call and specify where/to whom we are sending his records. Left request in pending folder for now.

## 2014-03-08 NOTE — Telephone Encounter (Signed)
Patient returned the call and requests his records to forwarded to the Medical records department at [facility].

## 2014-04-02 ENCOUNTER — Ambulatory Visit (INDEPENDENT_AMBULATORY_CARE_PROVIDER_SITE_OTHER): Payer: 59 | Admitting: Family

## 2014-04-02 ENCOUNTER — Encounter: Payer: Self-pay | Admitting: Family

## 2014-04-02 VITALS — BP 112/68 | Temp 98.5°F | Wt 159.0 lb

## 2014-04-02 DIAGNOSIS — Z23 Encounter for immunization: Secondary | ICD-10-CM

## 2014-04-02 NOTE — Progress Notes (Signed)
Pre visit review using our clinic review tool, if applicable. No additional management support is needed unless otherwise documented below in the visit note. 

## 2014-04-02 NOTE — Progress Notes (Signed)
   Subjective:    Patient ID: Alex Diaz, male    DOB: [birth year], 33 y.o.   MRN: [id number]  HPI    Review of Systems In for immunizations only    Objective:   Physical Exam        Assessment & Plan:

## 2014-05-03 ENCOUNTER — Ambulatory Visit: Payer: 59

## 2014-06-02 ENCOUNTER — Emergency Department (HOSPITAL_COMMUNITY)
Admission: EM | Admit: 2014-06-02 | Discharge: 2014-06-02 | Disposition: A | Discharge status: Home / Self Care | Payer: 59 | Attending: Emergency Medicine | Admitting: Emergency Medicine

## 2014-06-02 ENCOUNTER — Encounter (HOSPITAL_COMMUNITY): Payer: Self-pay | Admitting: Emergency Medicine

## 2014-06-02 ENCOUNTER — Emergency Department (HOSPITAL_COMMUNITY): Payer: 59

## 2014-06-02 DIAGNOSIS — Z8719 Personal history of other diseases of the digestive system: Secondary | ICD-10-CM | POA: Insufficient documentation

## 2014-06-02 DIAGNOSIS — R55 Syncope and collapse: Secondary | ICD-10-CM | POA: Diagnosis not present

## 2014-06-02 DIAGNOSIS — R404 Transient alteration of awareness: Secondary | ICD-10-CM | POA: Diagnosis present

## 2014-06-02 DIAGNOSIS — Z87891 Personal history of nicotine dependence: Secondary | ICD-10-CM | POA: Diagnosis not present

## 2014-06-02 LAB — BASIC METABOLIC PANEL
Anion gap: 12 (ref 5–15)
BUN: 18 mg/dL (ref 6–23)
CO2: 27 mEq/L (ref 19–32)
Calcium: 9.1 mg/dL (ref 8.4–10.5)
Chloride: 101 mEq/L (ref 96–112)
Creatinine, Ser: 1.34 mg/dL (ref 0.50–1.35)
GFR calc Af Amer: 81 mL/min — ABNORMAL LOW (ref >90)
GFR calc non Af Amer: 70 mL/min — ABNORMAL LOW (ref >90)
Glucose, Bld: 70 mg/dL (ref 70–99)
Potassium: 4.1 mEq/L (ref 3.7–5.3)
Sodium: 140 mEq/L (ref 137–147)

## 2014-06-02 LAB — CBC WITH DIFFERENTIAL/PLATELET
BASOS ABS: 0 10*3/uL (ref 0.0–0.1)
Basophils Relative: 0 % (ref 0–1)
EOS ABS: 0.4 10*3/uL (ref 0.0–0.7)
Eosinophils Relative: 5 % (ref 0–5)
HCT: 40.3 % (ref 39.0–52.0)
Hemoglobin: 13.3 g/dL (ref 13.0–17.0)
LYMPHS ABS: 1.9 10*3/uL (ref 0.7–4.0)
Lymphocytes Relative: 25 % (ref 12–46)
MCH: 30 pg (ref 26.0–34.0)
MCHC: 33 g/dL (ref 30.0–36.0)
MCV: 90.8 fL (ref 78.0–100.0)
Monocytes Absolute: 0.6 10*3/uL (ref 0.1–1.0)
Monocytes Relative: 9 % (ref 3–12)
NEUTROS PCT: 61 % (ref 43–77)
Neutro Abs: 4.6 10*3/uL (ref 1.7–7.7)
Platelets: 160 10*3/uL (ref 150–400)
RBC: 4.44 MIL/uL (ref 4.22–5.81)
RDW: 12.7 % (ref 11.5–15.5)
WBC: 7.5 10*3/uL (ref 4.0–10.5)

## 2014-06-02 LAB — CBG MONITORING, ED: Glucose-Capillary: 122 mg/dL — ABNORMAL HIGH (ref 70–99)

## 2014-06-02 IMAGING — CT CT HEAD W/O CM
1 series · 16 of 30 positions shown, 20 images · non-contrast
Comparison: None.

CLINICAL DATA: Syncope post trauma

EXAM:
CT HEAD WITHOUT CONTRAST
TECHNIQUE: Contiguous axial images were obtained from the base of the skull
through the vertex without intravenous contrast.

[Series 2: head 5.0 h30s · axial · 0.43mm/px · z∈[-120,+15]mm · 16 of 31 slices shown, 20 images]
[im 2/31  brain]
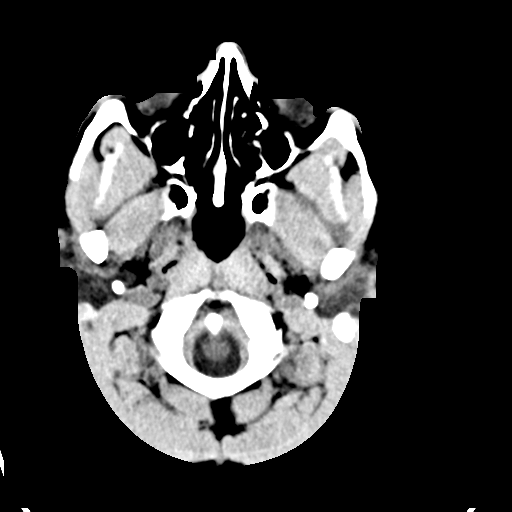
[im 2/31  bone]
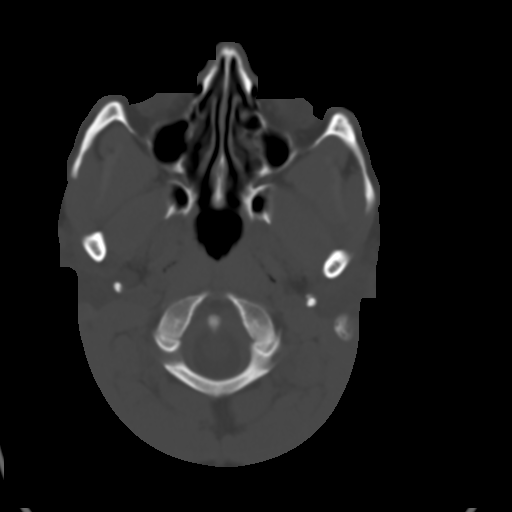
[im 4/31  brain]
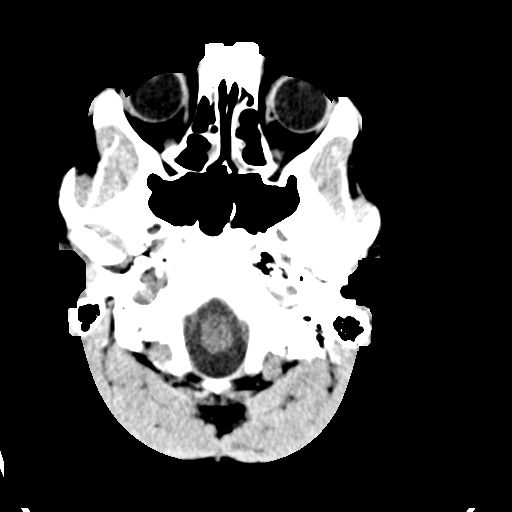
[im 6/31  brain]
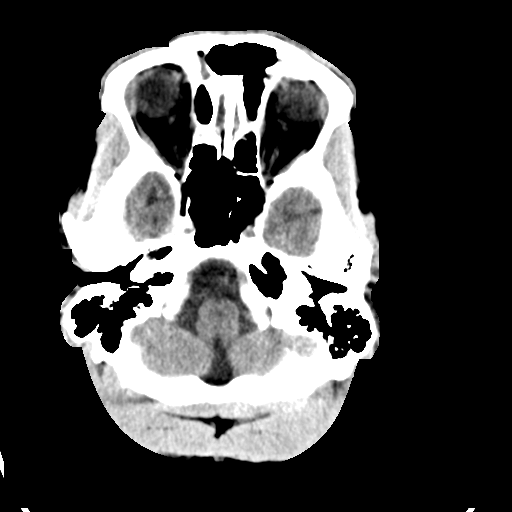
[im 8/31  brain]
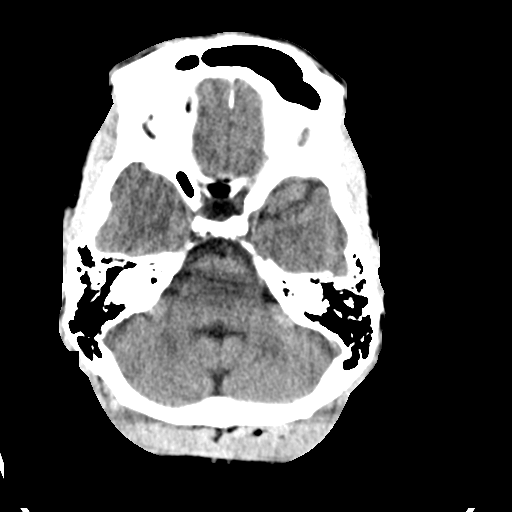
[im 9/31  brain]
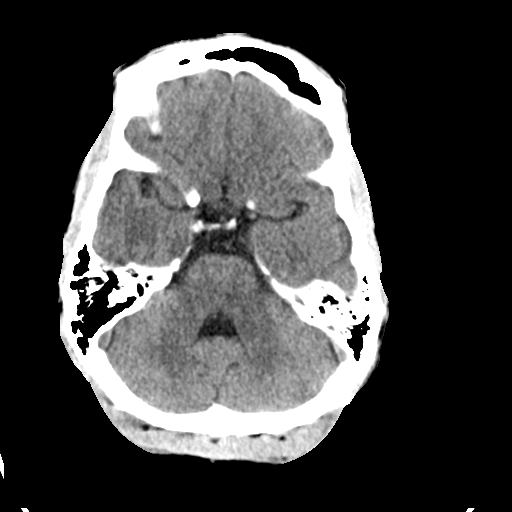
[im 9/31  bone]
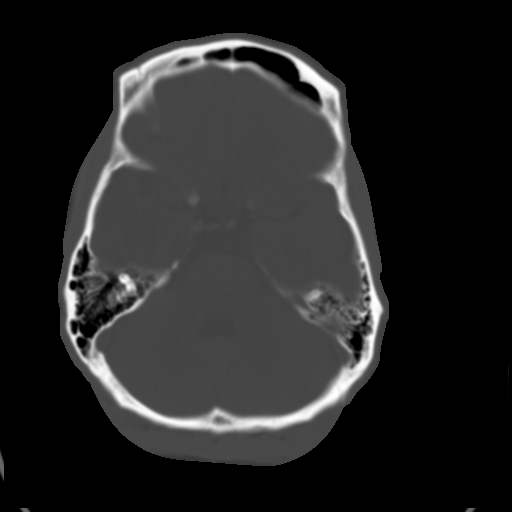
[im 11/31  brain]
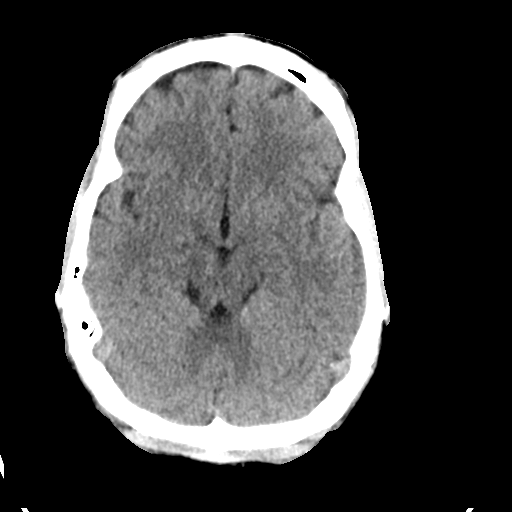
[im 13/31  brain]
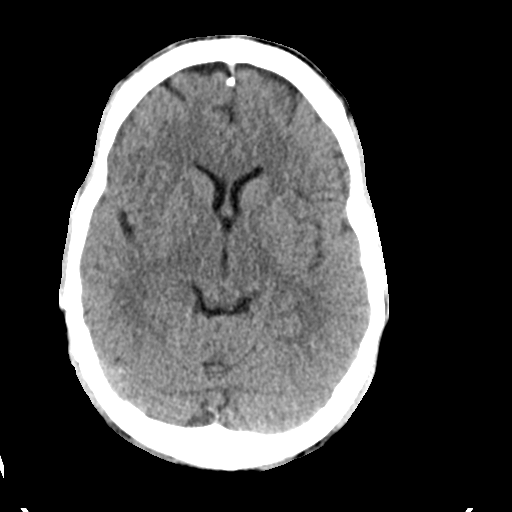
[im 15/31  brain]
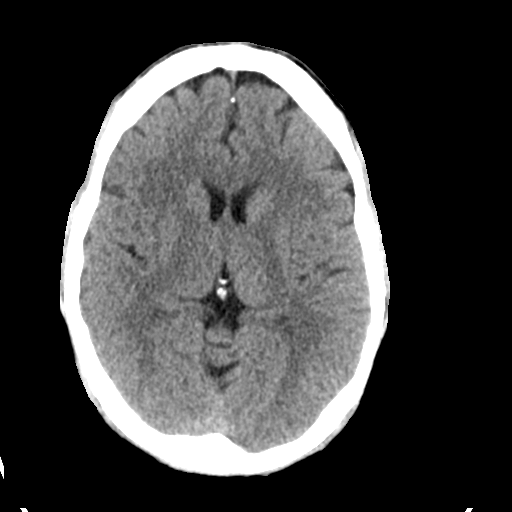
[im 16/31  brain]
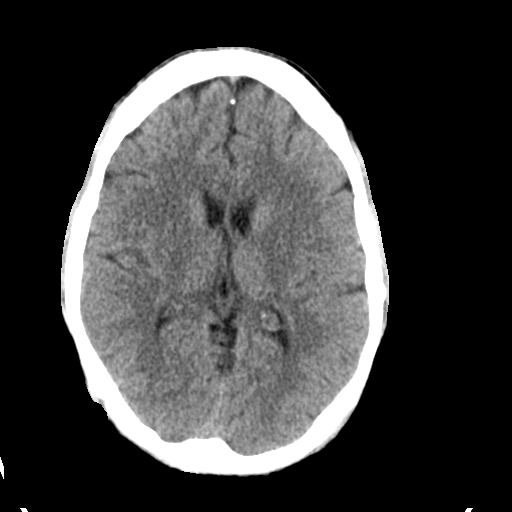
[im 16/31  bone]
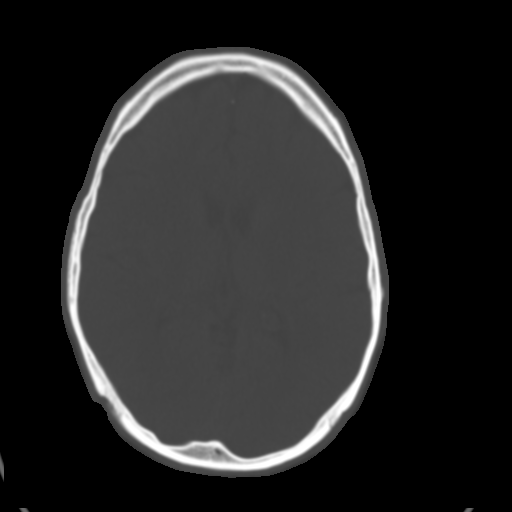
[im 18/31  brain]
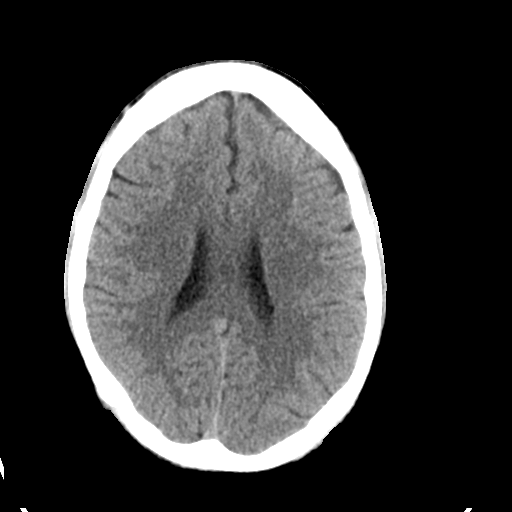
[im 20/31  brain]
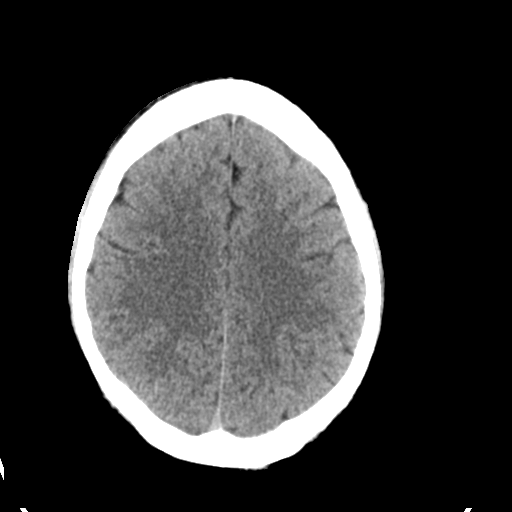
[im 22/31  brain]
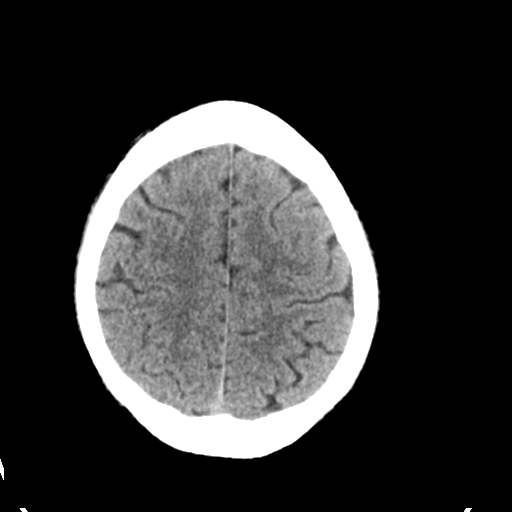
[im 23/31  brain]
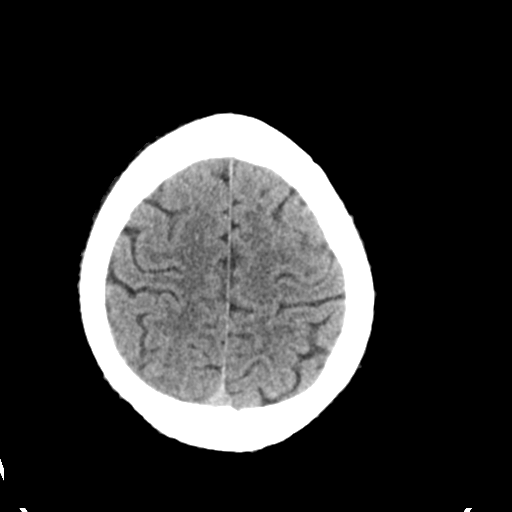
[im 23/31  bone]
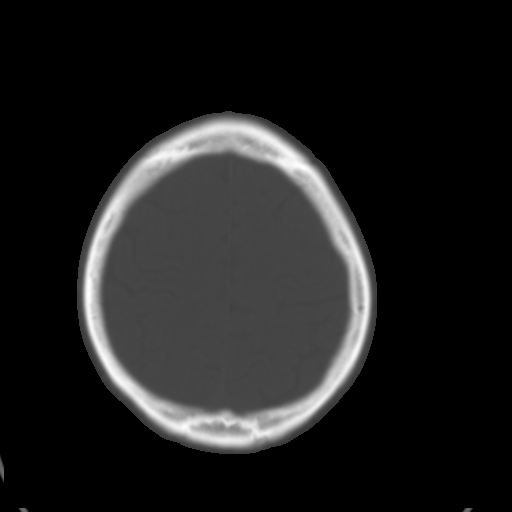
[im 25/31  brain]
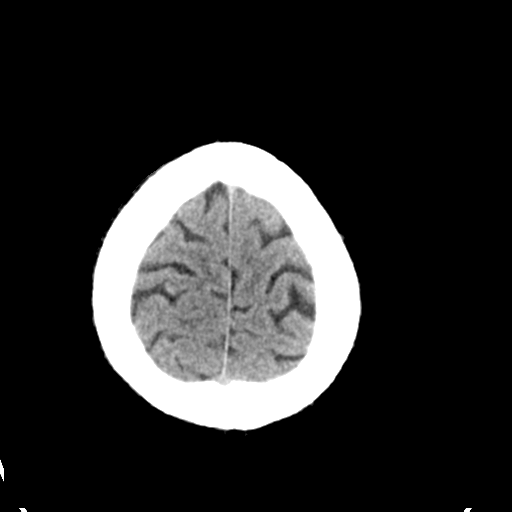
[im 27/31  brain]
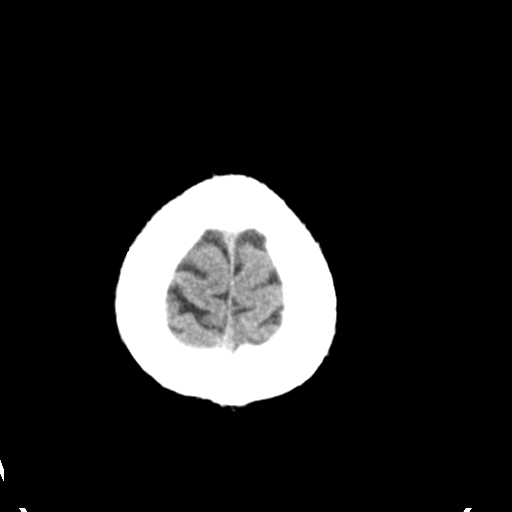
[im 29/31  brain]
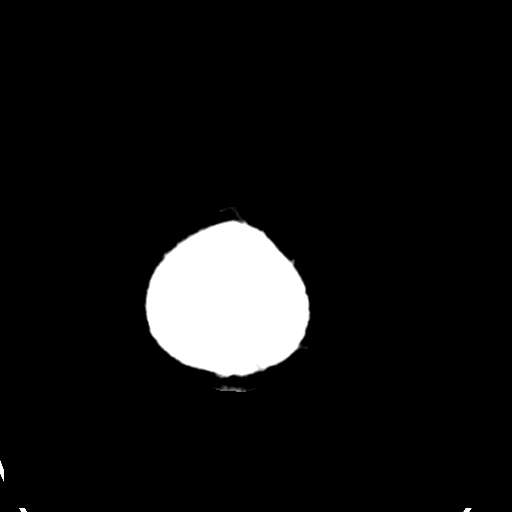

[16 of 30 positions shown; findings below may reference images not displayed]

FINDINGS: The ventricles are normal in size and configuration. There is no
mass, hemorrhage, extra-axial fluid collection, or midline shift.
The gray-white compartments are normal. Bony calvarium appears
intact. The mastoid air cells are clear. There is bilateral ethmoid
sinus disease. There is also opacification in a portion of the right
frontal sinus.
IMPRESSION: Multilevel paranasal sinus disease. Study otherwise unremarkable. No
demonstrable intracranial mass, hemorrhage, or extra-axial fluid.

## 2014-06-02 MED ORDER — IBUPROFEN 200 MG PO TABS
600.0000 mg | ORAL_TABLET | Freq: Once | ORAL | Status: AC
Start: 2014-06-02 — End: 2014-06-02
  Administered 2014-06-02: 600 mg via ORAL
  Filled 2014-06-02: qty 3

## 2014-06-02 MED ORDER — SODIUM CHLORIDE 0.9 % IV BOLUS (SEPSIS)
1000.0000 mL | Freq: Once | INTRAVENOUS | Status: AC
  Administered 2014-06-02: 1000 mL via INTRAVENOUS

## 2014-06-02 NOTE — ED Notes (Addendum)
Per EMS pt had syncopal episode at shift change while at work. Pt c/o of HA. Pt denies any hx of syncopal episodes, and seizures. EMS vitals CBG 128, BP 130/84. Pt is alert but to tired to speak. Shakes head for yes and no questions.

## 2014-06-02 NOTE — Discharge Instructions (Signed)
Syncope °Syncope is a medical term for fainting or passing out. This means you lose consciousness and drop to the ground. People are generally unconscious for less than 5 minutes. You may have some muscle twitches for up to 15 seconds before waking up and returning to normal. Syncope occurs more often in older adults, but it can happen to anyone. While most causes of syncope are not dangerous, syncope can be a sign of a serious medical problem. It is important to seek medical care.  °CAUSES  °Syncope is caused by a sudden drop in blood flow to the brain. The specific cause is often not determined. Factors that can bring on syncope include: °· Taking medicines that lower blood pressure. °· Sudden changes in posture, such as standing up quickly. °· Taking more medicine than prescribed. °· Standing in one place for too long. °· Seizure disorders. °· Dehydration and excessive exposure to heat. °· Low blood sugar (hypoglycemia). °· Straining to have a bowel movement. °· Heart disease, irregular heartbeat, or other circulatory problems. °· Fear, emotional distress, seeing blood, or severe pain. °SYMPTOMS  °Right before fainting, you may: °· Feel dizzy or light-headed. °· Feel nauseous. °· See all white or all black in your field of vision. °· Have cold, clammy skin. °DIAGNOSIS  °Your health care provider will ask about your symptoms, perform a physical exam, and perform an electrocardiogram (ECG) to record the electrical activity of your heart. Your health care provider may also perform other heart or blood tests to determine the cause of your syncope which may include: °· Transthoracic echocardiogram (TTE). During echocardiography, sound waves are used to evaluate how blood flows through your heart. °· Transesophageal echocardiogram (TEE). °· Cardiac monitoring. This allows your health care provider to monitor your heart rate and rhythm in real time. °· Holter monitor. This is a portable device that records your  heartbeat and can help diagnose heart arrhythmias. It allows your health care provider to track your heart activity for several days, if needed. °· Stress tests by exercise or by giving medicine that makes the heart beat faster. °TREATMENT  °In most cases, no treatment is needed. Depending on the cause of your syncope, your health care provider may recommend changing or stopping some of your medicines. °HOME CARE INSTRUCTIONS °· Have someone stay with you until you feel stable. °· Do not drive, use machinery, or play sports until your health care provider says it is okay. °· Keep all follow-up appointments as directed by your health care provider. °· Lie down right away if you start feeling like you might faint. Breathe deeply and steadily. Wait until all the symptoms have passed. °· Drink enough fluids to keep your urine clear or pale yellow. °· If you are taking blood pressure or heart medicine, get up slowly and take several minutes to sit and then stand. This can reduce dizziness. °SEEK IMMEDIATE MEDICAL CARE IF:  °· You have a severe headache. °· You have unusual pain in the chest, abdomen, or back. °· You are bleeding from your mouth or rectum, or you have black or tarry stool. °· You have an irregular or very fast heartbeat. °· You have pain with breathing. °· You have repeated fainting or seizure-like jerking during an episode. °· You faint when sitting or lying down. °· You have confusion. °· You have trouble walking. °· You have severe weakness. °· You have vision problems. °If you fainted, call your local emergency services (911 in [place] Do not drive   yourself to the hospital.  °MAKE SURE YOU: °· Understand these instructions. °· Will watch your condition. °· Will get help right away if you are not doing well or get worse. °Document Released: 09/24/2005 Document Revised: 09/29/2013 Document Reviewed: 11/23/2011 °ExitCare® Patient Information ©2015 [employer]. This information is not intended to replace  advice given to you by your health care provider. Make sure you discuss any questions you have with your health care provider. ° °

## 2014-06-02 NOTE — ED Notes (Signed)
Pt alert and oriented at discharge.  Pt taken to the waiting room in the wheelchair.

## 2014-06-02 NOTE — ED Provider Notes (Signed)
CSN: [id number]     Arrival date & time 06/02/14  0740 History   First MD Initiated Contact with Patient 06/02/14 [phone]     Chief Complaint  Patient presents with  . Loss of Consciousness     (Consider location/radiation/quality/duration/timing/severity/associated sxs/prior Treatment) Patient is a 33 y.o. male presenting with syncope.  Loss of Consciousness Episode history:  Single Most recent episode:  Today Duration: several minutes. Timing:  Constant Progression:  Resolved Chronicity:  New Context comment:  Pt was changing shifts after 12 hour shift.  After giving report he began to walk away and collapsed.   Witnessed: yes   Relieved by:  Lying down Associated symptoms: no fever, no nausea, no seizures (bystanders deny convulsions at time of incident as well) and no vomiting     Past Medical History  Diagnosis Date  . Headache(784.0)   . GERD (gastroesophageal reflux disease)    History reviewed. No pertinent past surgical history. Family History  Problem Relation Age of Onset  . Hypertension Father   . Kidney disease Father   . Diabetes Paternal Aunt   . Cancer Maternal Grandmother     lung  . Diabetes Maternal Grandfather   . Heart disease Paternal Grandmother   . Stroke Paternal Grandmother    History  Substance Use Topics  . Smoking status: Former Games developer  . Smokeless tobacco: Not on file  . Alcohol Use: Yes     Comment: occassionally    Review of Systems  Constitutional: Negative for fever.  Cardiovascular: Positive for syncope.  Gastrointestinal: Negative for nausea and vomiting.  Neurological: Negative for seizures (bystanders deny convulsions at time of incident as well).  All other systems reviewed and are negative.     Allergies  Review of patient's allergies indicates no known allergies.  Home Medications   Prior to Admission medications   Not on File   BP 133/72  Pulse 77  Temp(Src) 97.9 F (36.6 C) (Oral)  Resp 20  Ht  (1.727  m)  Wt 170 lb (77.111 kg)  BMI 25.85 kg/m2  SpO2 100% Physical Exam  Nursing note and vitals reviewed. Constitutional: He is oriented to person, place, and time. He appears well-developed and well-nourished. No distress.  HENT:  Head: Normocephalic and atraumatic.  Mouth/Throat: Oropharynx is clear and moist.  Eyes: Conjunctivae are normal. Pupils are equal, round, and reactive to light. No scleral icterus.  Neck: Neck supple.  Cardiovascular: Normal rate, regular rhythm, normal heart sounds and intact distal pulses.   No murmur heard. Pulmonary/Chest: Effort normal and breath sounds normal. No stridor. No respiratory distress. He has no wheezes. He has no rales.  Abdominal: Soft. He exhibits no distension. There is no tenderness.  Musculoskeletal: Normal range of motion. He exhibits no edema.  Neurological: He is alert and oriented to person, place, and time.  Skin: Skin is warm and dry. No rash noted.  Psychiatric: He has a normal mood and affect. His behavior is normal.    ED Course  Procedures (including critical care time) Labs Review Labs Reviewed  BASIC METABOLIC PANEL - Abnormal; Notable for the following:    GFR calc non Af Amer 70 (*)    GFR calc Af Amer 81 (*)    All other components within normal limits  CBG MONITORING, ED - Abnormal; Notable for the following:    Glucose-Capillary 122 (*)    All other components within normal limits  CBC WITH DIFFERENTIAL    Imaging Review Ct Head  Wo Contrast  06/02/2014   CLINICAL DATA:  Syncope post trauma  EXAM: CT HEAD WITHOUT CONTRAST  TECHNIQUE: Contiguous axial images were obtained from the base of the skull through the vertex without intravenous contrast.  COMPARISON:  None.  FINDINGS: The ventricles are normal in size and configuration. There is no mass, hemorrhage, extra-axial fluid collection, or midline shift. The gray-white compartments are normal. Bony calvarium appears intact. The mastoid air cells are clear. There is  bilateral ethmoid sinus disease. There is also opacification in a portion of the right frontal sinus.  IMPRESSION: Multilevel paranasal sinus disease. Study otherwise unremarkable. No demonstrable intracranial mass, hemorrhage, or extra-axial fluid.   Electronically Signed   By: Bretta Bang M.D.   On: 06/02/2014 10:16  All radiology studies independently viewed by me.      EKG Interpretation   Date/Time:  Wednesday June 02 2014 07:49:57 EDT Ventricular Rate:  73 PR Interval:  179 QRS Duration: 85 QT Interval:  382 QTC Calculation: 421 R Axis:   41 Text Interpretation:  Sinus rhythm Borderline repolarization abnormality  No old tracing to compare Confirmed by [facility]  MD, TREY ([id number]) on  06/02/2014 11:40:15 AM      MDM   Final diagnoses:  Syncope, unspecified syncope type    33 yo male with syncope while leaving work.  On initial interview, pt is unable to give a clear history because he is "tired".  However, bystanders report that he passed out and was unconscious for a few minutes without convulsions.  He was also found to be moderately hypoglycemic with blood sugar in 50's.    After time and IV fluids in ED, pt feels much better.  He developed right facial and temple swelling.  A CT head was checked and negative.  Labwork was negative.  EKG had some nonspecific t wave changes, but no brugadas, delta waves, arrhythmias, or prolonged QT to suggest cardiogenic syncope.  He denied family history of sudden cardiac death.    Ultimately, I suspect his syncope was orthostatic/vasovagal secondary to a combination of mild hypoglycemia, fatigue, and mild dehydration.  He appears stable for DC home with PCP follow up.    Candyce Churn III, MD 06/02/14 [phone]

## 2014-06-02 NOTE — ED Notes (Signed)
Notified RN of CBG 122

## 2014-06-03 ENCOUNTER — Telehealth: Payer: Self-pay | Admitting: Family

## 2014-06-03 NOTE — Telephone Encounter (Signed)
Pt went to ed yesterday due to fainting speel. Pt hit his face when he fell and its swollen. Wife called to make appt. No  30 min appts. pls advise were to work in and how soon. See another provider?

## 2014-06-03 NOTE — Telephone Encounter (Signed)
appt scheduled; thanks.

## 2014-06-03 NOTE — Telephone Encounter (Signed)
Please schedule pt for the 11:15/11:30 tomorrow

## 2014-06-04 ENCOUNTER — Telehealth: Payer: Self-pay | Admitting: Family

## 2014-06-04 ENCOUNTER — Encounter: Payer: Self-pay | Admitting: Family

## 2014-06-04 ENCOUNTER — Ambulatory Visit (INDEPENDENT_AMBULATORY_CARE_PROVIDER_SITE_OTHER): Payer: 59 | Admitting: Family

## 2014-06-04 VITALS — BP 112/76 | HR 70 | Wt 171.8 lb

## 2014-06-04 DIAGNOSIS — R55 Syncope and collapse: Secondary | ICD-10-CM

## 2014-06-04 NOTE — Telephone Encounter (Signed)
Pt requesting call back regarding work note that was written for him.

## 2014-06-04 NOTE — Progress Notes (Signed)
   Subjective:    Patient ID: Alex Diaz, male    DOB: [birth year], 33 y.o.   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 After a 12 hour shift, patient had a syncopal episode and without several minutes witnessed by his coworkers. He had a CT scan, EKG, and labs done at the emergency department and was found to be mildly dehydrated, mildly hypoglycemic, and had a fatigue. Emergency department doctor verbalized patient was barely able to answer questions he is to significant fatigue. Has been working approximately 60 hours weekly and going to school. His wife reports he does not get enough rest.   Review of Systems  Constitutional: Negative.   HENT: Negative.   Respiratory: Negative.   Cardiovascular: Negative.   Gastrointestinal: Negative.   Endocrine: Negative.   Genitourinary: Negative.   Musculoskeletal: Negative.   Skin: Negative.   Allergic/Immunologic: Negative.   Neurological: Positive for syncope. Negative for tremors, weakness, light-headedness and headaches.  Hematological: Negative.   Psychiatric/Behavioral: Negative.    Past Medical History  Diagnosis Date  . Headache(784.0)   . GERD (gastroesophageal reflux disease)     History   Social History  . Marital Status: Married    Spouse Name: N/A    Number of Children: N/A  . Years of Education: N/A   Occupational History  . Not on file.   Social History Main Topics  . Smoking status: Former Games developer  . Smokeless tobacco: Not on file  . Alcohol Use: Yes     Comment: occassionally  . Drug Use: No  . Sexual Activity: Yes   Other Topics Concern  . Not on file   Social History Narrative  . No narrative on file    No past surgical history on file.  Family History  Problem Relation Age of Onset  . Hypertension Father   . Kidney disease Father   . Diabetes Paternal Aunt   . Cancer Maternal Grandmother     lung  . Diabetes Maternal Grandfather   . Heart disease Paternal Grandmother   . Stroke Paternal Grandmother     No Known Allergies  No current outpatient prescriptions on file prior to visit.   No current facility-administered medications on file prior to visit.    BP 112/76  Pulse 70  Wt 171 lb 12.8 oz (77.928 kg)chart    Objective:   Physical Exam  Constitutional: He is oriented to person, place, and time. He appears well-developed and well-nourished.  HENT:  Right Ear: External ear normal.  Left Ear: External ear normal.  Nose: Nose normal.  Mouth/Throat: Oropharynx is clear and moist.  Neck: Normal range of motion. Neck supple.  Cardiovascular: Normal rate, regular rhythm and normal heart sounds.   Pulmonary/Chest: Effort normal and breath sounds normal.  Abdominal: Soft. Bowel sounds are normal.  Musculoskeletal: Normal range of motion.  Neurological: He is alert and oriented to person, place, and time.  Skin: Skin is warm and dry.  Psychiatric: He has a normal mood and affect.          Assessment & Plan:  Alex Diaz was seen today for follow-up.  Diagnoses and associated orders for this visit:  Syncope and collapse    Advise light duty x1 week. Drink plenty of water. Intermittent snack to prevent hypoglycemia. Decreased workload. Call the office with any questions or concerns.

## 2014-06-04 NOTE — Patient Instructions (Signed)
Syncope °Syncope is a medical term for fainting or passing out. This means you lose consciousness and drop to the ground. People are generally unconscious for less than 5 minutes. You may have some muscle twitches for up to 15 seconds before waking up and returning to normal. Syncope occurs more often in older adults, but it can happen to anyone. While most causes of syncope are not dangerous, syncope can be a sign of a serious medical problem. It is important to seek medical care.  °CAUSES  °Syncope is caused by a sudden drop in blood flow to the brain. The specific cause is often not determined. Factors that can bring on syncope include: °· Taking medicines that lower blood pressure. °· Sudden changes in posture, such as standing up quickly. °· Taking more medicine than prescribed. °· Standing in one place for too long. °· Seizure disorders. °· Dehydration and excessive exposure to heat. °· Low blood sugar (hypoglycemia). °· Straining to have a bowel movement. °· Heart disease, irregular heartbeat, or other circulatory problems. °· Fear, emotional distress, seeing blood, or severe pain. °SYMPTOMS  °Right before fainting, you may: °· Feel dizzy or light-headed. °· Feel nauseous. °· See all white or all black in your field of vision. °· Have cold, clammy skin. °DIAGNOSIS  °Your health care provider will ask about your symptoms, perform a physical exam, and perform an electrocardiogram (ECG) to record the electrical activity of your heart. Your health care provider may also perform other heart or blood tests to determine the cause of your syncope which may include: °· Transthoracic echocardiogram (TTE). During echocardiography, sound waves are used to evaluate how blood flows through your heart. °· Transesophageal echocardiogram (TEE). °· Cardiac monitoring. This allows your health care provider to monitor your heart rate and rhythm in real time. °· Holter monitor. This is a portable device that records your  heartbeat and can help diagnose heart arrhythmias. It allows your health care provider to track your heart activity for several days, if needed. °· Stress tests by exercise or by giving medicine that makes the heart beat faster. °TREATMENT  °In most cases, no treatment is needed. Depending on the cause of your syncope, your health care provider may recommend changing or stopping some of your medicines. °HOME CARE INSTRUCTIONS °· Have someone stay with you until you feel stable. °· Do not drive, use machinery, or play sports until your health care provider says it is okay. °· Keep all follow-up appointments as directed by your health care provider. °· Lie down right away if you start feeling like you might faint. Breathe deeply and steadily. Wait until all the symptoms have passed. °· Drink enough fluids to keep your urine clear or pale yellow. °· If you are taking blood pressure or heart medicine, get up slowly and take several minutes to sit and then stand. This can reduce dizziness. °SEEK IMMEDIATE MEDICAL CARE IF:  °· You have a severe headache. °· You have unusual pain in the chest, abdomen, or back. °· You are bleeding from your mouth or rectum, or you have black or tarry stool. °· You have an irregular or very fast heartbeat. °· You have pain with breathing. °· You have repeated fainting or seizure-like jerking during an episode. °· You faint when sitting or lying down. °· You have confusion. °· You have trouble walking. °· You have severe weakness. °· You have vision problems. °If you fainted, call your local emergency services (911 in [place] Do not drive   yourself to the hospital.  °MAKE SURE YOU: °· Understand these instructions. °· Will watch your condition. °· Will get help right away if you are not doing well or get worse. °Document Released: 09/24/2005 Document Revised: 09/29/2013 Document Reviewed: 11/23/2011 °ExitCare® Patient Information ©2015 [employer]. This information is not intended to replace  advice given to you by your health care provider. Make sure you discuss any questions you have with your health care provider. ° °

## 2014-06-04 NOTE — Telephone Encounter (Signed)
Left message for pt to call back Monday if he still needs to

## 2014-06-04 NOTE — Progress Notes (Signed)
Pre visit review using our clinic review tool, if applicable. No additional management support is needed unless otherwise documented below in the visit note. 

## 2014-06-07 ENCOUNTER — Telehealth: Payer: Self-pay | Admitting: Family

## 2014-06-07 NOTE — Telephone Encounter (Signed)
Notes ready for pick up and pt aware

## 2014-06-07 NOTE — Telephone Encounter (Signed)
Left message for pt to call back to verify that dates for work and school notes are correct

## 2014-06-07 NOTE — Telephone Encounter (Signed)
Pt was seen on 8-28 and needs two notes one for job and one for college. Pt needs work note form 8-28 - return to work on 06-09-14. And school notes from 8/26/ thru 8/28. Pt did not go to work this past weekend. pt stated he rested.

## 2014-06-07 NOTE — Telephone Encounter (Signed)
Pt states all info is correct. Pt would like to pu later today

## 2014-07-28 ENCOUNTER — Encounter: Payer: Self-pay | Admitting: Family Medicine

## 2014-07-28 ENCOUNTER — Ambulatory Visit (INDEPENDENT_AMBULATORY_CARE_PROVIDER_SITE_OTHER): Payer: 59 | Admitting: Family Medicine

## 2014-07-28 VITALS — BP 120/70 | HR 71 | Wt 171.0 lb

## 2014-07-28 DIAGNOSIS — M10072 Idiopathic gout, left ankle and foot: Secondary | ICD-10-CM

## 2014-07-28 DIAGNOSIS — M109 Gout, unspecified: Secondary | ICD-10-CM

## 2014-07-28 MED ORDER — INDOMETHACIN 50 MG PO CAPS: 50.0000 mg | ORAL_CAPSULE | Freq: Three times a day (TID) | ORAL | Status: DC | PRN

## 2014-07-28 NOTE — Progress Notes (Signed)
Pre visit review using our clinic review tool, if applicable. No additional management support is needed unless otherwise documented below in the visit note. 

## 2014-07-28 NOTE — Patient Instructions (Signed)

## 2014-07-28 NOTE — Progress Notes (Signed)
   Subjective:    Patient ID: Alex BoronChristoffer D Durnil, male    DOB: [birth year], 33 y.o.   MRN: [id number]  HPI 33 year old presents with L foot pain starting at the 1st MTP joint with little radiation into the toe and plantar aspect the foot.  This has been present for 4 days.  Patient is on his feet a lot at work in boots.  Does not know of any injury.  Was walking slow when it first started, and now it is very painful to walk.  All movements cause pain.  He has tried baer with little relief.  Today was the first day he noticed swelling, erythema and warmth.  Denies fever.  States his father and grandfather both had gout.  Denies heavy alcohol consumption; drinks wine occasionally.       Review of Systems  Constitutional: Positive for activity change. Negative for fever.  Respiratory: Negative for cough, chest tightness and shortness of breath.   Cardiovascular: Negative for chest pain, palpitations and leg swelling.  Gastrointestinal: Negative for nausea and vomiting.  Musculoskeletal: Positive for gait problem and joint swelling. Negative for myalgias.       Limping to keep weight off the left foot.  Edema to MPT joint of left foot.    Skin: Negative for rash.       Objective:   Physical Exam  Constitutional: He is oriented to person, place, and time. He appears well-developed and well-nourished.  Patient winced when removing sock and stated that removing socks and shoes is very painful.    Cardiovascular: Normal rate, regular rhythm, normal heart sounds and intact distal pulses.   No murmur heard. Pulmonary/Chest: Effort normal and breath sounds normal.  Musculoskeletal: He exhibits tenderness.  Right foot ROM normal.  Left ankle ROM normal.  Decreased ROM and pain with movement to left toes.  Point tenderness over 1st MTP.    Neurological: He is alert and oriented to person, place, and time.  Skin: He is not diaphoretic.  Psychiatric: He has a normal mood and affect. His behavior is  normal. Judgment and thought content normal.          Assessment & Plan:  Gout- Patient started on indomethacin 50 mg TID x 5-7 days.  With is family history, this is very likely.  He was informed on common food triggers and was given a handout for gout.  He was also educated to take the medication with food.  Patient told to call clinic if pain was not resolving in a few days.   Luiz OchoaMelissa Demecia Northway PA-S   As above.  Acute monoarticular inflammation of left 1st MTP joint in absence of fever or injury   Suspect acute gout.  Treat as above and f/u with primary for recurrence.  Evelena PeatBruce Burchette MD

## 2015-05-11 ENCOUNTER — Encounter: Payer: Self-pay | Admitting: Family

## 2015-05-11 ENCOUNTER — Ambulatory Visit (INDEPENDENT_AMBULATORY_CARE_PROVIDER_SITE_OTHER): Payer: 59 | Admitting: Family

## 2015-05-11 VITALS — BP 112/80 | HR 69 | Temp 98.1°F | Ht 68.0 in | Wt 164.0 lb

## 2015-05-11 DIAGNOSIS — Z0000 Encounter for general adult medical examination without abnormal findings: Secondary | ICD-10-CM

## 2015-05-11 DIAGNOSIS — Z113 Encounter for screening for infections with a predominantly sexual mode of transmission: Secondary | ICD-10-CM | POA: Diagnosis not present

## 2015-05-11 LAB — COMPREHENSIVE METABOLIC PANEL
ALK PHOS: 53 U/L (ref 39–117)
ALT: 22 U/L (ref 0–53)
AST: 32 U/L (ref 0–37)
Albumin: 4.5 g/dL (ref 3.5–5.2)
BUN: 13 mg/dL (ref 6–23)
CALCIUM: 9.7 mg/dL (ref 8.4–10.5)
CO2: 30 mEq/L (ref 19–32)
Chloride: 103 mEq/L (ref 96–112)
Creatinine, Ser: 1.3 mg/dL (ref 0.40–1.50)
GFR: 82.35 mL/min (ref >60.00)
GLUCOSE: 94 mg/dL (ref 70–99)
Potassium: 5.3 mEq/L — ABNORMAL HIGH (ref 3.5–5.1)
SODIUM: 141 meq/L (ref 135–145)
TOTAL PROTEIN: 7.8 g/dL (ref 6.0–8.3)
Total Bilirubin: 0.4 mg/dL (ref 0.2–1.2)

## 2015-05-11 LAB — CBC WITH DIFFERENTIAL/PLATELET
BASOS ABS: 0 10*3/uL (ref 0.0–0.1)
Basophils Relative: 0.4 % (ref 0.0–3.0)
EOS PCT: 3.2 % (ref 0.0–5.0)
Eosinophils Absolute: 0.2 10*3/uL (ref 0.0–0.7)
HEMATOCRIT: 42.7 % (ref 39.0–52.0)
Hemoglobin: 14 g/dL (ref 13.0–17.0)
LYMPHS PCT: 34.5 % (ref 12.0–46.0)
Lymphs Abs: 2.2 10*3/uL (ref 0.7–4.0)
MCHC: 32.8 g/dL (ref 30.0–36.0)
MCV: 91.7 fl (ref 78.0–100.0)
MONO ABS: 0.6 10*3/uL (ref 0.1–1.0)
MONOS PCT: 9.7 % (ref 3.0–12.0)
Neutro Abs: 3.3 10*3/uL (ref 1.4–7.7)
Neutrophils Relative %: 52.2 % (ref 43.0–77.0)
Platelets: 158 10*3/uL (ref 150.0–400.0)
RBC: 4.66 Mil/uL (ref 4.22–5.81)
RDW: 14.2 % (ref 11.5–15.5)
WBC: 6.3 10*3/uL (ref 4.0–10.5)

## 2015-05-11 LAB — POCT URINALYSIS DIPSTICK
Bilirubin, UA: NEGATIVE
Blood, UA: NEGATIVE
GLUCOSE UA: NEGATIVE
Ketones, UA: NEGATIVE
Leukocytes, UA: NEGATIVE
Nitrite, UA: NEGATIVE
Spec Grav, UA: 1.025
Urobilinogen, UA: 0.2
pH, UA: 6

## 2015-05-11 LAB — LIPID PANEL
Cholesterol: 240 mg/dL — ABNORMAL HIGH (ref 0–200)
HDL: 51.9 mg/dL (ref >39.00)
LDL CALC: 174 mg/dL — AB (ref 0–99)
NonHDL: 187.74
Total CHOL/HDL Ratio: 5
Triglycerides: 70 mg/dL (ref 0.0–149.0)
VLDL: 14 mg/dL (ref 0.0–40.0)

## 2015-05-11 NOTE — Patient Instructions (Signed)
Exercise to Stay Healthy Exercise helps you become and stay healthy. EXERCISE IDEAS AND TIPS Choose exercises that:  You enjoy.  Fit into your day. You do not need to exercise really hard to be healthy. You can do exercises at a slow or medium level and stay healthy. You can:  Stretch before and after working out.  Try yoga, Pilates, or tai chi.  Lift weights.  Walk fast, swim, jog, run, climb stairs, bicycle, dance, or rollerskate.  Take aerobic classes. Exercises that burn about 150 calories:  Running 1  miles in 15 minutes.  Playing volleyball for 45 to 60 minutes.  Washing and waxing a car for 45 to 60 minutes.  Playing touch football for 45 minutes.  Walking 1  miles in 35 minutes.  Pushing a stroller 1  miles in 30 minutes.  Playing basketball for 30 minutes.  Raking leaves for 30 minutes.  Bicycling 5 miles in 30 minutes.  Walking 2 miles in 30 minutes.  Dancing for 30 minutes.  Shoveling snow for 15 minutes.  Swimming laps for 20 minutes.  Walking up stairs for 15 minutes.  Bicycling 4 miles in 15 minutes.  Gardening for 30 to 45 minutes.  Jumping rope for 15 minutes.  Washing windows or floors for 45 to 60 minutes. Document Released: 10/27/2010 Document Revised: 12/17/2011 Document Reviewed: 10/27/2010 [facility] Patient Information 2015 [place], [place]. This information is not intended to replace advice given to you by your health care provider. Make sure you discuss any questions you have with your health care provider.

## 2015-05-11 NOTE — Progress Notes (Signed)
   Subjective:    Patient ID: MEHTAAB MAYEDA, male    DOB: Jul 17, 1981, 34 y.o.   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 Denies any concerns. Reports exercising daily and following a healthy diet. Denies any concerns any sexually transmitted diseases.  Review of Systems  Constitutional: Negative.   HENT: Negative.   Eyes: Negative.   Respiratory: Negative.   Cardiovascular: Negative.   Gastrointestinal: Negative.   Endocrine: Negative.   Genitourinary: Negative.   Musculoskeletal: Negative.   Skin: Negative.   Allergic/Immunologic: Negative.   Neurological: Negative.   Hematological: Negative.   Psychiatric/Behavioral: Negative.    Past Medical History  Diagnosis Date  . Headache(784.0)   . GERD (gastroesophageal reflux disease)     History   Social History  . Marital Status: Married    Spouse Name: N/A  . Number of Children: N/A  . Years of Education: N/A   Occupational History  . Not on file.   Social History Main Topics  . Smoking status: Former Games developer  . Smokeless tobacco: Not on file  . Alcohol Use: Yes     Comment: occassionally  . Drug Use: No  . Sexual Activity: Yes   Other Topics Concern  . Not on file   Social History Narrative    No past surgical history on file.  Family History  Problem Relation Age of Onset  . Hypertension Father   . Kidney disease Father   . Diabetes Paternal Aunt   . Cancer Maternal Grandmother     lung  . Diabetes Maternal Grandfather   . Heart disease Paternal Grandmother   . Stroke Paternal Grandmother     No Known Allergies  No current outpatient prescriptions on file prior to visit.   No current facility-administered medications on file prior to visit.    BP 112/80 mmHg  Pulse 69  Temp(Src) 98.1 F (36.7 C)  Ht  (1.727 m)  Wt 164 lb (74.39 kg)  BMI 24.94 kg/m2chart    Objective:   Physical Exam  Constitutional:  He is oriented to person, place, and time. He appears well-developed and well-nourished.  HENT:  Right Ear: External ear normal.  Left Ear: External ear normal.  Nose: Nose normal.  Mouth/Throat: Oropharynx is clear and moist.  Eyes: Conjunctivae are normal. Pupils are equal, round, and reactive to light.  Neck: Normal range of motion. Neck supple.  Cardiovascular: Normal rate, regular rhythm and normal heart sounds.   Pulmonary/Chest: Effort normal and breath sounds normal.  Abdominal: Soft. Bowel sounds are normal.  Genitourinary: Penis normal.  Musculoskeletal: Normal range of motion.  Neurological: He is alert and oriented to person, place, and time.  Skin: Skin is warm and dry.  Psychiatric: He has a normal mood and affect.          Assessment & Plan:  Jsoeph was seen today for cpx.  Diagnoses and all orders for this visit:  Preventative health care Orders: -     Lipid Panel -     CBC with Differential -     POC Urinalysis Dipstick -     CMP  Screen for sexually transmitted diseases Orders: -     HIV antibody (with reflex) -     GC/Chlamydia Amp Probe, Urine -     RPR   Call the office with any questions or concerns. Recheck in one year and sooner as needed.

## 2015-05-12 LAB — HIV ANTIBODY (ROUTINE TESTING W REFLEX): HIV: NONREACTIVE

## 2015-05-12 LAB — GC/CHLAMYDIA PROBE AMP, URINE
Chlamydia, Swab/Urine, PCR: NEGATIVE
GC Probe Amp, Urine: NEGATIVE

## 2015-05-12 LAB — RPR

## 2015-05-18 ENCOUNTER — Telehealth: Payer: Self-pay | Admitting: Family

## 2015-05-18 NOTE — Telephone Encounter (Signed)
Pt returning your call concerning labs from [facility]/ [facility].

## 2015-05-18 NOTE — Telephone Encounter (Signed)
Left message on machine for patient returning his call. 

## 2015-05-26 ENCOUNTER — Encounter: Payer: Self-pay | Admitting: *Deleted

## 2015-05-27 NOTE — Telephone Encounter (Signed)
Letter mailed to patient for him to return our call for lab results

## 2015-06-01 ENCOUNTER — Telehealth: Payer: Self-pay | Admitting: Family

## 2015-06-01 DIAGNOSIS — E785 Hyperlipidemia, unspecified: Secondary | ICD-10-CM

## 2015-06-01 NOTE — Telephone Encounter (Addendum)
Pt would like blood work results. Pt is returning rachel call

## 2015-06-02 NOTE — Telephone Encounter (Signed)
Patient is aware of lab results. Lipid ordered and lab appointment made.

## 2015-06-02 NOTE — Telephone Encounter (Signed)
Left message on machine for patient to return our call 

## 2015-08-31 ENCOUNTER — Other Ambulatory Visit (INDEPENDENT_AMBULATORY_CARE_PROVIDER_SITE_OTHER): Payer: 59

## 2015-08-31 DIAGNOSIS — E785 Hyperlipidemia, unspecified: Secondary | ICD-10-CM | POA: Diagnosis not present

## 2015-08-31 LAB — LIPID PANEL
Cholesterol: 189 mg/dL (ref 0–200)
HDL: 44.2 mg/dL (ref >39.00)
LDL CALC: 117 mg/dL — AB (ref 0–99)
NONHDL: 144.61
Total CHOL/HDL Ratio: 4
Triglycerides: 140 mg/dL (ref 0.0–149.0)
VLDL: 28 mg/dL (ref 0.0–40.0)

## 2016-02-14 ENCOUNTER — Other Ambulatory Visit: Payer: Self-pay | Admitting: Occupational Medicine

## 2016-02-14 ENCOUNTER — Ambulatory Visit
Admission: RE | Admit: 2016-02-14 | Discharge: 2016-02-14 | Disposition: A | Discharge status: Home / Self Care | Payer: No Typology Code available for payment source | Source: Ambulatory Visit | Attending: Occupational Medicine | Admitting: Occupational Medicine

## 2016-02-14 DIAGNOSIS — Z021 Encounter for pre-employment examination: Secondary | ICD-10-CM

## 2016-02-14 IMAGING — CR DG CHEST 1V
1 series · 1 of 1 positions shown · non-contrast
Comparison: None.

CLINICAL DATA: Pre-employment examination

EXAM:
CHEST 1 VIEW

[w chest pa]
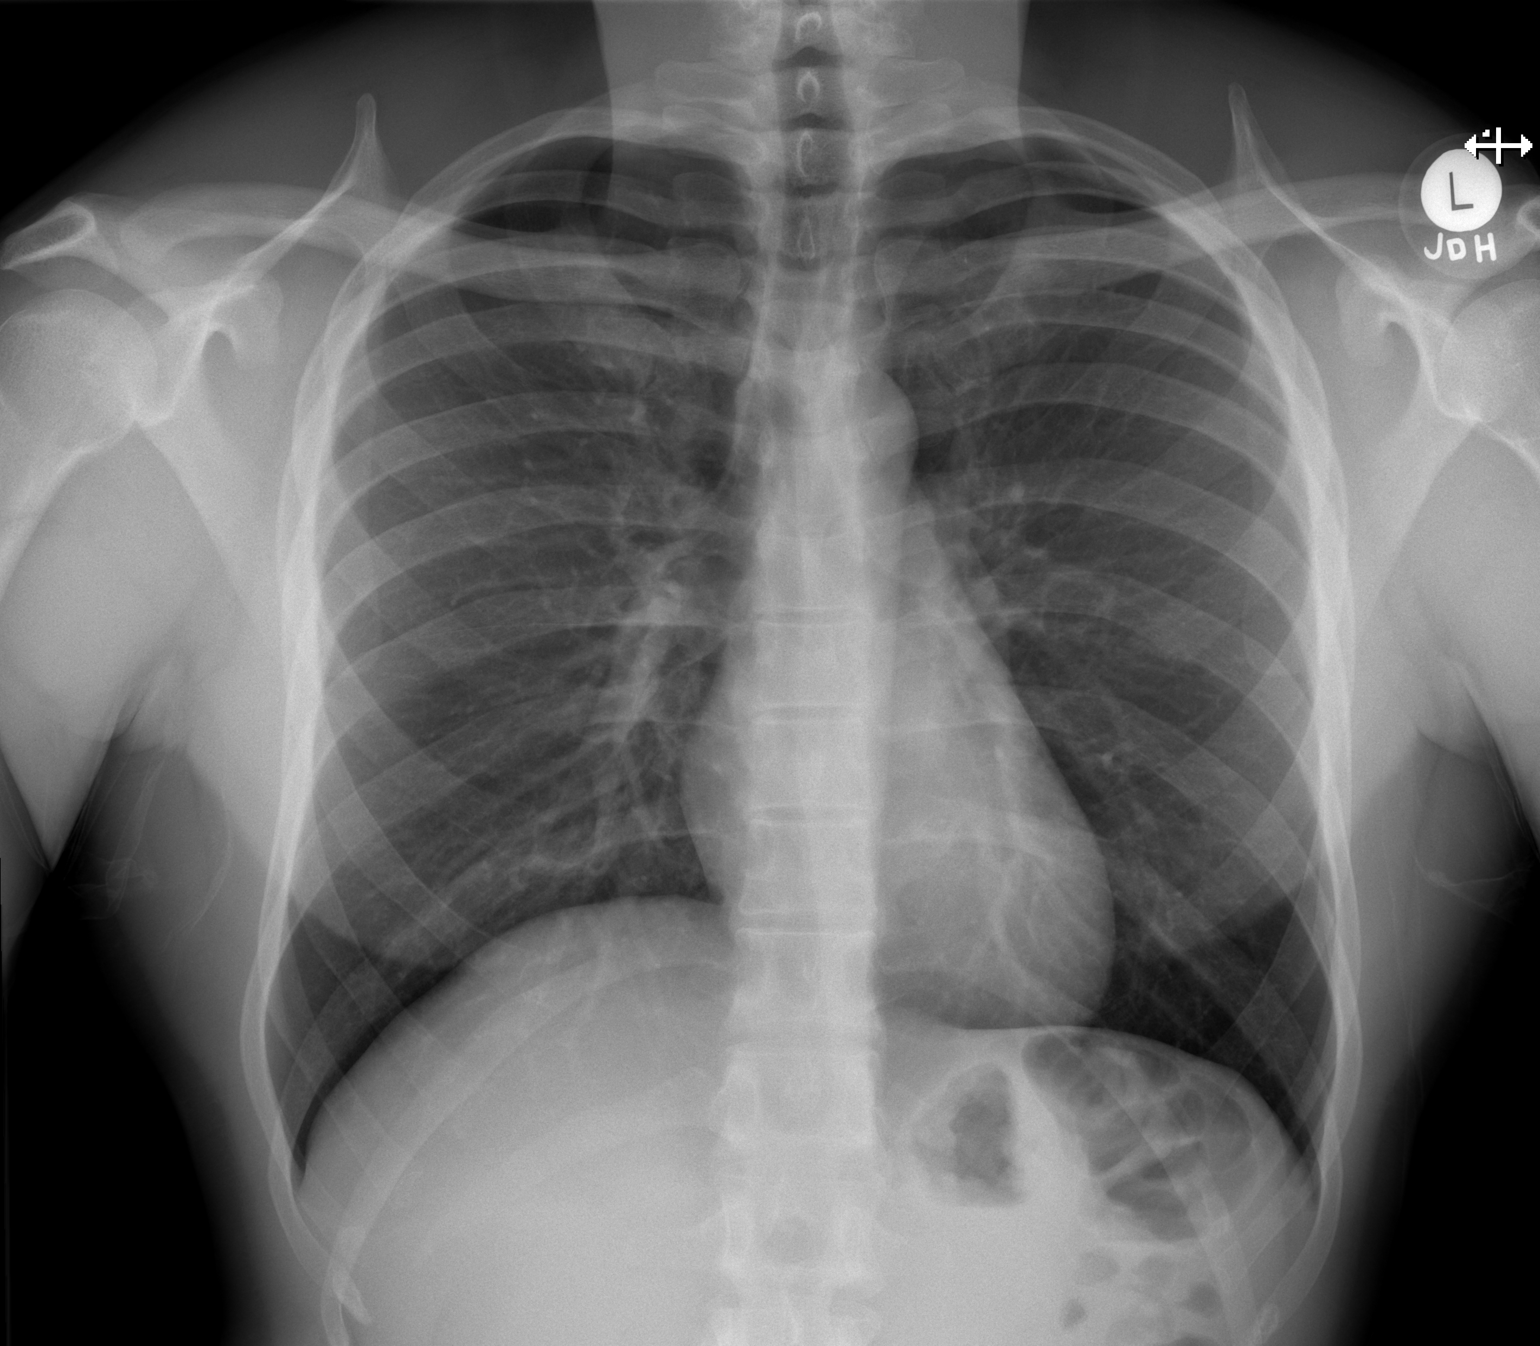

[1 of 1 positions shown; findings below may reference images not displayed]

FINDINGS: The heart size and mediastinal contours are within normal limits.
Both lungs are clear. The visualized skeletal structures are
unremarkable.
IMPRESSION: No active disease.

## 2016-02-28 ENCOUNTER — Ambulatory Visit (INDEPENDENT_AMBULATORY_CARE_PROVIDER_SITE_OTHER): Payer: 59 | Admitting: Adult Health

## 2016-02-28 ENCOUNTER — Encounter: Payer: Self-pay | Admitting: Adult Health

## 2016-02-28 VITALS — BP 100/78 | Temp 97.9°F | Wt 172.8 lb

## 2016-02-28 DIAGNOSIS — R9431 Abnormal electrocardiogram [ECG] [EKG]: Secondary | ICD-10-CM | POA: Diagnosis not present

## 2016-02-28 NOTE — Progress Notes (Signed)
   Subjective:    Patient ID: Alex Diaz, male    DOB: Jan 15, 1981, 35 y.o.   MRN: [id number]  HPI  35 year old healthy and active male, who presents to the office today for abnormal EKG. 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 This is consistent with a prior EKG in 2015.   He denies any CP, SOB, dizziness or syncopal episodes.   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    Review of Systems  Constitutional: Negative.   Respiratory: Negative.   Cardiovascular: Negative.   Musculoskeletal: Negative.   Psychiatric/Behavioral: Negative.   All other systems reviewed and are negative.  Past Medical History  Diagnosis Date  . Headache(784.0)   . GERD (gastroesophageal reflux disease)     Social History   Social History  . Marital Status: Married    Spouse Name: N/A  . Number of Children: N/A  . Years of Education: N/A   Occupational History  . Not on file.   Social History Main Topics  . Smoking status: Former Games developermoker  . Smokeless tobacco: Not on file  . Alcohol Use: Yes     Comment: occassionally  . Drug Use: No  . Sexual Activity: Yes   Other Topics Concern  . Not on file   Social History Narrative    No past surgical history on file.  Family History  Problem Relation Age of Onset  . Hypertension Father   . Kidney disease Father   . Diabetes Paternal Aunt   . Cancer Maternal Grandmother     lung  . Diabetes Maternal Grandfather   . Heart disease Paternal Grandmother   . Stroke Paternal Grandmother     No Known Allergies  No current outpatient prescriptions on file prior to visit.   No current facility-administered medications on file prior to visit.    BP 100/78 mmHg  Temp(Src) 97.9 F (36.6 C) (Oral)  Wt 172 lb 12.8 oz (78.382 kg)       Objective:   Physical Exam  Constitutional: He is oriented to person,  place, and time. He appears well-developed and well-nourished. No distress.  Cardiovascular: Normal rate, regular rhythm, normal heart sounds and intact distal pulses.  Exam reveals no gallop and no friction rub.   No murmur heard. Pulmonary/Chest: Effort normal and breath sounds normal. No respiratory distress. He has no wheezes. He has no rales. He exhibits no tenderness.  Neurological: He is alert and oriented to person, place, and time.  Skin: Skin is warm and dry. No rash noted. He is not diaphoretic. No erythema. No pallor.  Psychiatric: He has a normal mood and affect. His behavior is normal. Judgment and thought content normal.  Nursing note and vitals reviewed.     Assessment & Plan:  1. Nonspecific abnormal electrocardiogram (ECG) (EKG) - Ambulatory referral to Cardiology - Follow up as needed  Shirline Freesory Elsbeth Yearick, NP

## 2016-02-28 NOTE — Patient Instructions (Signed)
Someone will call you to schedule your appointment with cardiology .   If you need anything in the meantime, please let me know

## 2016-03-10 NOTE — Progress Notes (Signed)
Patient ID: Alex Diaz, male   DOB: [birth year], 35 y.o.   MRN: [id number]     Cardiology Office Note   Date:  03/13/2016   ID:  Alex Diaz, DOB [birth year], MRN [id number]  PCP:  Shirline Frees, NP  Cardiologist:   Charlton Haws, MD   No chief complaint on file.     History of Present Illness: Alex Diaz is a 35 y.o. male who presents for evaluation of abnormal ECG ECG with nonspecific ST changes and biphasic lateral T waves similar to ECG done 2015.  He is trying to get into police academy.   Denies any symptoms of chest pain, palpitations syncope or dyspnea. No high risk family history. He has a total of 7 brothers / sisters all doing fine . He did get evaluated for syncope in 2015 Reviewed ER notes from [facility] August 2015 and they felt his BS was low 50, dehydrated and vasovagal.  ECG with nonspecific ST changes. No echo done at that time just hydrated.  He is active works out, bowls and runs with no issues Denies History of head trauma seizures.  Denies ETOH , Drugs excess ETOH  College grad Still working at detention center in [place]   CXR 02/14/16  NAD no CE Past Medical History  Diagnosis Date  . Headache(784.0)   . GERD (gastroesophageal reflux disease)     No previous surgeries :    No current outpatient prescriptions on file.   No current facility-administered medications for this visit.    Allergies:   Review of patient's allergies indicates no known allergies.    Social History:  The patient  reports that he has quit smoking. He does not have any smokeless tobacco history on file. He reports that he drinks alcohol. He reports that he does not use illicit drugs.   Family History:  The patient's family history includes Cancer in his maternal grandmother; Diabetes in his maternal grandfather and paternal aunt; Heart disease in his paternal grandmother; Hypertension in his father; Kidney disease in his father; Stroke in his paternal grandmother.     ROS:  Please see the history of present illness.   Otherwise, review of systems are positive for none.   All other systems are reviewed and negative.    PHYSICAL EXAM: VS:  BP 126/84 mmHg  Pulse 71  Ht  (1.702 m)  Wt 78.019 kg (172 lb)  BMI 26.93 kg/m2  SpO2 97% , BMI Body mass index is 26.93 kg/(m^2). Affect appropriate Healthy:  appears stated age HEENT: normal Neck supple with no adenopathy JVP normal no bruits no thyromegaly Lungs clear with no wheezing and good diaphragmatic motion Heart:  S1/S2 no murmur, no rub, gallop or click PMI normal Abdomen: benighn, BS positve, no tenderness, no AAA no bruit.  No HSM or HJR Distal pulses intact with no bruits No edema Neuro non-focal Skin warm and dry No muscular weakness    EKG:  06/03/14  SR biphasic T waves in lateral leads   02/28/16  SR rate 66  Asymmetric T wave inversions inferior lateral leads more pronounced than 2015   Recent Labs: 05/11/2015: ALT 22; BUN 13; Creatinine, Ser 1.30; Hemoglobin 14.0; Platelets 158.0; Potassium 5.3*; Sodium 141    Lipid Panel    Component Value Date/Time   CHOL 189 08/31/2015 0834   TRIG 140.0 08/31/2015 0834   HDL 44.20 08/31/2015 0834   CHOLHDL 4 08/31/2015 0834   VLDL 28.0 08/31/2015 0834   LDLCALC  117* 08/31/2015 0834   LDLDIRECT 145.9 04/22/2012 1400      Wt Readings from Last 3 Encounters:  03/13/16 78.019 kg (172 lb)  02/28/16 78.382 kg (172 lb 12.8 oz)  05/11/15 74.39 kg (164 lb)      Other studies Reviewed: Additional studies/ records that were reviewed today include: Epic notes ER records/ labs ECG from 2015.    ASSESSMENT AND PLAN:  1.  Abnormal ECG  Encouraging that he has no HOCM murmur ECG suggests ;possibility and may have non obstructive form. Given history of syncope need cardiac CT to r/o CAD and anomalous ostia and echo to r/o HOCM or other congenital dx. He has no history of HTN to explain biphasic inferolateral T wave changes 2. Syncope:   2015 non recurrent records indicate low BS and dehydration see above commnets regarding w/u  No high risk family history no pre excitation and normal QT   Current medicines are reviewed at length with the patient today.  The patient does not have concerns regarding medicines.  The following changes have been made:  no change  Labs/ tests ordered today include: Cardiac CTA and Echo   Orders Placed This Encounter  Procedures  . EKG 12-Lead  . ECHOCARDIOGRAM COMPLETE     Disposition:   FU with me in a year if tests normal      Signed, Charlton HawsPeter Jacqlyn Marolf, MD  03/13/2016 9:05 AM    [facility] [address], [place], [place]  [address] Phone: [phone]; Fax: [phone]

## 2016-03-13 ENCOUNTER — Ambulatory Visit (HOSPITAL_COMMUNITY)
Admission: RE | Admit: 2016-03-13 | Discharge: 2016-03-13 | Disposition: A | Discharge status: Home / Self Care | Payer: 59 | Source: Ambulatory Visit | Attending: Cardiovascular Disease | Admitting: Cardiovascular Disease

## 2016-03-13 ENCOUNTER — Ambulatory Visit (INDEPENDENT_AMBULATORY_CARE_PROVIDER_SITE_OTHER): Payer: 59 | Admitting: Cardiovascular Disease

## 2016-03-13 ENCOUNTER — Other Ambulatory Visit (HOSPITAL_COMMUNITY)
Admission: RE | Admit: 2016-03-13 | Discharge: 2016-03-13 | Disposition: A | Discharge status: Home / Self Care | Payer: 59 | Source: Ambulatory Visit | Attending: Cardiovascular Disease | Admitting: Cardiovascular Disease

## 2016-03-13 ENCOUNTER — Encounter: Payer: Self-pay | Admitting: Cardiovascular Disease

## 2016-03-13 VITALS — BP 126/84 | HR 71 | Ht 67.0 in | Wt 172.0 lb

## 2016-03-13 DIAGNOSIS — I422 Other hypertrophic cardiomyopathy: Secondary | ICD-10-CM

## 2016-03-13 DIAGNOSIS — R9431 Abnormal electrocardiogram [ECG] [EKG]: Secondary | ICD-10-CM | POA: Diagnosis not present

## 2016-03-13 LAB — BASIC METABOLIC PANEL
ANION GAP: 5 (ref 5–15)
BUN: 21 mg/dL — ABNORMAL HIGH (ref 6–20)
CALCIUM: 8.8 mg/dL — AB (ref 8.9–10.3)
CHLORIDE: 102 mmol/L (ref 101–111)
CO2: 28 mmol/L (ref 22–32)
Creatinine, Ser: 1.19 mg/dL (ref 0.61–1.24)
GFR calc non Af Amer: >60 mL/min (ref >60)
GLUCOSE: 96 mg/dL (ref 65–99)
Potassium: 3.7 mmol/L (ref 3.5–5.1)
Sodium: 135 mmol/L (ref 135–145)

## 2016-03-13 LAB — ECHOCARDIOGRAM COMPLETE
Height: 67 in
Weight: 2752 oz

## 2016-03-13 NOTE — Addendum Note (Signed)
Addended by: Kerney ElbePINNIX, Juniper Snyders G on: 03/13/2016 09:17 AM   Modules accepted: Orders

## 2016-03-13 NOTE — Patient Instructions (Signed)
Your physician recommends that you schedule a follow-up appointment To be determined after your test.   Your physician recommends that you continue on your current medications as directed. Please refer to the Current Medication list given to you today.  Your physician recommends that you have lab work done today.  Your physician has requested that you have an echocardiogram. Echocardiography is a painless test that uses sound waves to create images of your heart. It provides your doctor with information about the size and shape of your heart and how well your heart's chambers and valves are working. This procedure takes approximately one hour. There are no restrictions for this procedure.  Your physician has requested that you have cardiac CT. Cardiac computed tomography (CT) is a painless test that uses an x-ray machine to take clear, detailed pictures of your heart. For further information please visit [email or web address]. Please follow instruction sheet as given.   If you need a refill on your cardiac medications before your next appointment, please call your pharmacy.  Thank you for choosing [facility]!

## 2016-03-14 ENCOUNTER — Telehealth: Payer: Self-pay | Admitting: Cardiovascular Disease

## 2016-03-14 NOTE — Telephone Encounter (Signed)
Left message for patient to call back. Patient has an order already for CT, that was ordered this am. Sent message to scheduler to arrange appointment, and sent to pre auth for approval.

## 2016-03-14 NOTE — Telephone Encounter (Signed)
New message  Pt called states that he was supposed to have a CT. Last ov notes says: Your physician has requested that you have cardiac CT. No orders for CT on file. Please assist

## 2016-03-21 ENCOUNTER — Telehealth: Payer: Self-pay | Admitting: Cardiovascular Disease

## 2016-03-21 DIAGNOSIS — I422 Other hypertrophic cardiomyopathy: Secondary | ICD-10-CM

## 2016-03-21 NOTE — Telephone Encounter (Signed)
Order placed

## 2016-03-21 NOTE — Telephone Encounter (Signed)
Pt called concerning a CT he's supposed to have done, but I do not see any orders for this test.

## 2016-03-22 ENCOUNTER — Telehealth: Payer: Self-pay | Admitting: Cardiovascular Disease

## 2016-03-22 NOTE — Telephone Encounter (Signed)
Patient called wanting CT Scan scheduled  York SpanielSaid he has been waiting for call back and needs it scheduled asap due to work.

## 2016-03-22 NOTE — Telephone Encounter (Signed)
Sent note to Delrae AlfredWertz, Ashley: regarding percert- she stated that  charmaine has been working with  Dr. Eden EmmsNishan on this one to see if he wants to do an MD review. If not it will be denied.    Spoke with patient and explain what was going on.

## 2016-03-23 NOTE — Telephone Encounter (Signed)
I gave approval number for cardiac CT to Charmain pleas schedule cardiac CTA for me to do next week

## 2016-03-26 ENCOUNTER — Encounter: Payer: Self-pay | Admitting: Cardiovascular Disease

## 2016-03-26 NOTE — Telephone Encounter (Signed)
Schedule for 03-29-16 @ 9 am

## 2016-03-26 NOTE — Telephone Encounter (Signed)
Follow Up ° °Pt returned call//  °

## 2016-03-29 ENCOUNTER — Telehealth: Payer: Self-pay

## 2016-03-29 ENCOUNTER — Ambulatory Visit (HOSPITAL_COMMUNITY)
Admission: RE | Admit: 2016-03-29 | Discharge: 2016-03-29 | Disposition: A | Discharge status: Home / Self Care | Payer: 59 | Source: Ambulatory Visit | Attending: Cardiovascular Disease | Admitting: Cardiovascular Disease

## 2016-03-29 DIAGNOSIS — R9431 Abnormal electrocardiogram [ECG] [EKG]: Secondary | ICD-10-CM | POA: Diagnosis present

## 2016-03-29 DIAGNOSIS — R55 Syncope and collapse: Secondary | ICD-10-CM | POA: Insufficient documentation

## 2016-03-29 DIAGNOSIS — I422 Other hypertrophic cardiomyopathy: Secondary | ICD-10-CM | POA: Diagnosis not present

## 2016-03-29 DIAGNOSIS — Z87891 Personal history of nicotine dependence: Secondary | ICD-10-CM | POA: Insufficient documentation

## 2016-03-29 DIAGNOSIS — R079 Chest pain, unspecified: Secondary | ICD-10-CM | POA: Diagnosis not present

## 2016-03-29 IMAGING — CT CT HEART MORP W/ CTA COR W/ SCORE W/ CA W/CM &/OR W/O CM
1 of 10 series · 1 of 20 positions shown, 2 images · IV contrast (Iodine)
Comparison: No priors.

CLINICAL DATA: Chest pain

EXAM:
Cardiac CTA
MEDICATIONS:
Sub lingual nitro. 4mg and lopressor 10mg
TECHNIQUE: The patient was scanned on a [employer] [REDACTED]ice scanner. Gantry
rotation speed was 270 msecs. Collimation was .9mm. A 100 kV
prospective scan was triggered in the descending thoracic aorta at
111 HU's with 5% padding centered around 78% of the R-R interval.
Average HR during the scan was 58 bpm. The 3D data set was
interpreted on a dedicated work station using MPR, MIP and VRT
modes. A total of 80cc of contrast was used.

[Series 300: locator · axial · 0.35mm/px · z∈[+668,+668]mm · 1 of 1 slices shown, 2 images]
[im 1/1  vessel]
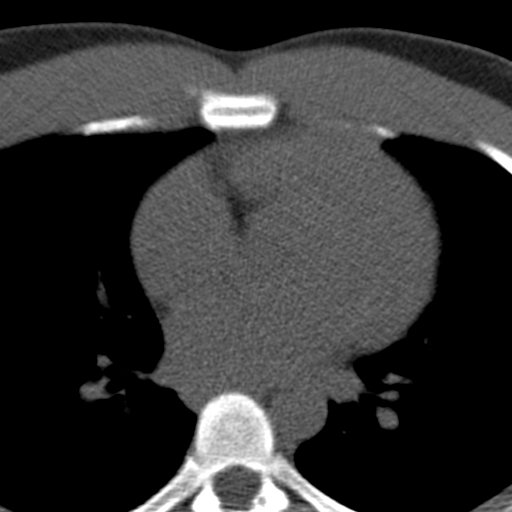
[im 1/1  lung]
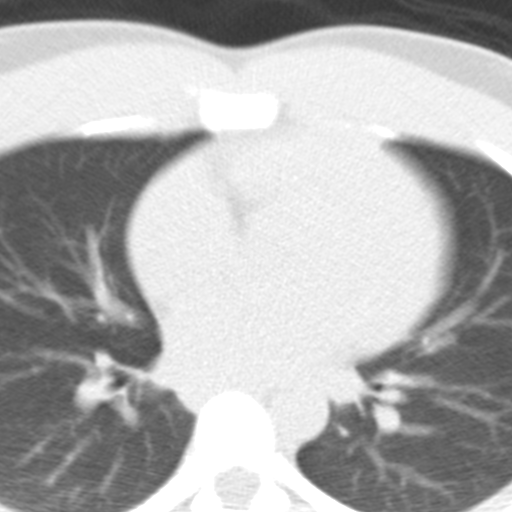

[1 of 20 positions shown; findings below may reference images not displayed]

FINDINGS: Non-cardiac: See separate report from [REDACTED]. No
significant findings on limited lung and soft tissue windows.

Calcium Score:  0

Coronary Arteries: Right dominant with no anomalies

LM: Normal

LAD:  Normal

IM:  Normal

D1: Normal

D2: Normal

Circumflex: Normal

OM1: Normal

OM2: Normal

RCA:  Right dominant normal

PDA: Normal

PLA:  Normal
IMPRESSION: 1) Calcium Score 0

2) Normal right dominant coronary arteries with no anomalies

3) Normal aortic root 3.0 cm

OBACO

EXAM:
OVER-READ INTERPRETATION  CT CHEST

The following report is an over-read performed by radiologist Dr.
over-read does not include interpretation of cardiac or coronary
anatomy or pathology. The coronary calcium score/coronary CTA
interpretation by the cardiologist is attached.
FINDINGS: Within the visualized portions of the thorax there are no suspicious
appearing pulmonary nodules or masses, there is no acute
consolidative airspace disease, no pleural effusions, no
pneumothorax and no lymphadenopathy. Visualized portions of the
upper abdomen are unremarkable. There are no aggressive appearing
lytic or blastic lesions noted in the visualized portions of the
skeleton.
IMPRESSION: 1. No significant incidental noncardiac findings noted.

## 2016-03-29 MED ORDER — NITROGLYCERIN 0.4 MG SL SUBL
0.8000 mg | SUBLINGUAL_TABLET | Freq: Once | SUBLINGUAL | Status: AC
  Administered 2016-03-29: 0.8 mg via SUBLINGUAL

## 2016-03-29 MED ORDER — NITROGLYCERIN 0.4 MG SL SUBL
SUBLINGUAL_TABLET | SUBLINGUAL | Status: AC
  Administered 2016-03-29: 0.8 mg via SUBLINGUAL
  Filled 2016-03-29: qty 1

## 2016-03-29 MED ORDER — IOPAMIDOL (ISOVUE-370) INJECTION 76%
80.0000 mL | Freq: Once | INTRAVENOUS | Status: AC | PRN
  Administered 2016-03-29: 80 mL via INTRAVENOUS

## 2016-03-29 MED ORDER — METOPROLOL TARTRATE 5 MG/5ML IV SOLN
5.0000 mg | INTRAVENOUS | Status: DC | PRN
  Administered 2016-03-29 (×2): 5 mg via INTRAVENOUS

## 2016-03-29 MED ORDER — METOPROLOL TARTRATE 5 MG/5ML IV SOLN
INTRAVENOUS | Status: AC
Start: 2016-03-29 — End: 2016-03-29
  Administered 2016-03-29: 5 mg via INTRAVENOUS
  Filled 2016-03-29: qty 10

## 2016-03-29 NOTE — Telephone Encounter (Signed)
Letter printed,lm for pt to call and let me know where he wants it faxed to

## 2016-03-29 NOTE — Telephone Encounter (Signed)
Per pt,faxed to [phone]

## 2016-03-29 NOTE — Telephone Encounter (Signed)
-----   Message from Wendall StadePeter C Nishan, MD sent at 03/29/2016 12:02 PM EDT ----- CT is normal Echo is normal can write letter indicated his heart is fine for the [employer] F/u with me in a year

## 2017-02-13 ENCOUNTER — Encounter: Payer: Self-pay | Admitting: Adult Health

## 2017-02-13 ENCOUNTER — Ambulatory Visit (INDEPENDENT_AMBULATORY_CARE_PROVIDER_SITE_OTHER): Payer: Managed Care, Other (non HMO) | Admitting: Adult Health

## 2017-02-13 VITALS — BP 100/76 | Temp 98.6°F | Ht 67.0 in | Wt 174.1 lb

## 2017-02-13 DIAGNOSIS — Z0000 Encounter for general adult medical examination without abnormal findings: Secondary | ICD-10-CM | POA: Diagnosis not present

## 2017-02-13 LAB — BASIC METABOLIC PANEL
BUN: 14 mg/dL (ref 6–23)
CALCIUM: 9.5 mg/dL (ref 8.4–10.5)
CO2: 30 meq/L (ref 19–32)
CREATININE: 1.25 mg/dL (ref 0.40–1.50)
Chloride: 105 mEq/L (ref 96–112)
GFR: 85.23 mL/min (ref >60.00)
GLUCOSE: 99 mg/dL (ref 70–99)
Potassium: 5 mEq/L (ref 3.5–5.1)
Sodium: 139 mEq/L (ref 135–145)

## 2017-02-13 LAB — CBC WITH DIFFERENTIAL/PLATELET
Basophils Absolute: 0 10*3/uL (ref 0.0–0.1)
Basophils Relative: 0.5 % (ref 0.0–3.0)
Eosinophils Absolute: 0.3 10*3/uL (ref 0.0–0.7)
Eosinophils Relative: 4.6 % (ref 0.0–5.0)
HEMATOCRIT: 41.1 % (ref 39.0–52.0)
HEMOGLOBIN: 13.4 g/dL (ref 13.0–17.0)
Lymphocytes Relative: 33.4 % (ref 12.0–46.0)
Lymphs Abs: 2.2 10*3/uL (ref 0.7–4.0)
MCHC: 32.7 g/dL (ref 30.0–36.0)
MCV: 92.7 fl (ref 78.0–100.0)
Monocytes Absolute: 0.5 10*3/uL (ref 0.1–1.0)
Monocytes Relative: 7.9 % (ref 3.0–12.0)
Neutro Abs: 3.5 10*3/uL (ref 1.4–7.7)
Neutrophils Relative %: 53.6 % (ref 43.0–77.0)
Platelets: 171 10*3/uL (ref 150.0–400.0)
RBC: 4.43 Mil/uL (ref 4.22–5.81)
RDW: 13.9 % (ref 11.5–15.5)
WBC: 6.4 10*3/uL (ref 4.0–10.5)

## 2017-02-13 LAB — LIPID PANEL
CHOLESTEROL: 231 mg/dL — AB (ref 0–200)
HDL: 58.1 mg/dL (ref >39.00)
LDL Cholesterol: 159 mg/dL — ABNORMAL HIGH (ref 0–99)
NonHDL: 172.78
Total CHOL/HDL Ratio: 4
Triglycerides: 70 mg/dL (ref 0.0–149.0)
VLDL: 14 mg/dL (ref 0.0–40.0)

## 2017-02-13 LAB — HEPATIC FUNCTION PANEL
ALBUMIN: 4.6 g/dL (ref 3.5–5.2)
ALT: 28 U/L (ref 0–53)
AST: 31 U/L (ref 0–37)
Alkaline Phosphatase: 56 U/L (ref 39–117)
Bilirubin, Direct: 0.1 mg/dL (ref 0.0–0.3)
TOTAL PROTEIN: 7.3 g/dL (ref 6.0–8.3)
Total Bilirubin: 0.5 mg/dL (ref 0.2–1.2)

## 2017-02-13 LAB — TSH: TSH: 0.83 u[IU]/mL (ref 0.35–4.50)

## 2017-02-13 NOTE — Progress Notes (Signed)
Subjective:    Patient ID: Alex Diaz, male    DOB: 07-May-1981, 36 y.o.   MRN: [id number]  HPI  Patient presents for yearly preventative medicine examination. He is a pleasant, active and extremely healthy 36 year old male who  has a past medical history of GERD (gastroesophageal reflux disease) and Headache(784.0).  All immunizations and health maintenance protocols were reviewed with the patient and needed orders were placed.  Appropriate screening laboratory values were ordered for the patient including screening of hyperlipidemia, renal function and hepatic function. If indicated by BPH, a PSA was ordered.  Medication reconciliation,  past medical history, social history, problem list and allergies were reviewed in detail with the patient  Goals were established with regard to weight loss, exercise, and  diet in compliance with medications. 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 He was cleared by cardiology last year for the physical fitness exam. He has to retake the test this year.    Review of Systems  Constitutional: Negative.   HENT: Negative.   Eyes: Negative.   Respiratory: Negative.   Cardiovascular: Negative.   Gastrointestinal: Negative.   Endocrine: Negative.   Genitourinary: Negative.   Musculoskeletal: Negative.   Skin: Negative.   Allergic/Immunologic: Negative.   Neurological: Negative.   Hematological: Negative.   Psychiatric/Behavioral: Negative.   All other systems reviewed and are negative.  Past Medical History:  Diagnosis Date  . GERD (gastroesophageal reflux disease)   . Headache(784.0)     Social History   Social History  . Marital status: Married    Spouse name: N/A  . Number of children: N/A  . Years of education: N/A   Occupational History  . Not on file.   Social History Main Topics  . Smoking status: Former Games developermoker  . Smokeless tobacco: Never Used  . Alcohol use  Yes     Comment: occassionally  . Drug use: No  . Sexual activity: Yes   Other Topics Concern  . Not on file   Social History Narrative  . No narrative on file    No past surgical history on file.  Family History  Problem Relation Age of Onset  . Hypertension Father   . Kidney disease Father   . Diabetes Paternal Aunt   . Cancer Maternal Grandmother     lung  . Diabetes Maternal Grandfather   . Heart disease Paternal Grandmother   . Stroke Paternal Grandmother     No Known Allergies  No current outpatient prescriptions on file prior to visit.   No current facility-administered medications on file prior to visit.     BP 100/76 (BP Location: Left Arm, Patient Position: Sitting, Cuff Size: Normal)   Temp 98.6 F (37 C) (Oral)   Ht 5\' 7"  (1.702 m)   Wt 174 lb 1.6 oz (79 kg)   BMI 27.27 kg/m       Objective:   Physical Exam  Constitutional: He is oriented to person, place, and time. He appears well-developed and well-nourished. No distress.  HENT:  Head: Normocephalic and atraumatic.  Right Ear: External ear normal.  Left Ear: External ear normal.  Nose: Nose normal.  Mouth/Throat: Oropharynx is clear and moist. No oropharyngeal exudate.  Eyes: Conjunctivae and EOM are normal. Pupils are equal, round, and reactive to light. Right eye exhibits no discharge. Left eye exhibits no discharge. No scleral icterus.  Neck: Normal range of motion. Neck supple. No  tracheal deviation present. No thyromegaly present.  Cardiovascular: Normal rate, regular rhythm, normal heart sounds and intact distal pulses.  Exam reveals no gallop and no friction rub.   No murmur heard. Pulmonary/Chest: Effort normal and breath sounds normal. No stridor. No respiratory distress. He has no wheezes. He has no rales. He exhibits no tenderness.  Abdominal: Soft. Bowel sounds are normal. He exhibits no distension and no mass. There is no tenderness. There is no rebound and no guarding.    Musculoskeletal: Normal range of motion. He exhibits no edema, tenderness or deformity.  Lymphadenopathy:    He has no cervical adenopathy.  Neurological: He is alert and oriented to person, place, and time. He has normal reflexes. He displays normal reflexes. No cranial nerve deficit. He exhibits normal muscle tone. Coordination normal.  Skin: Skin is warm and dry. No rash noted. He is not diaphoretic. No erythema. No pallor.  Psychiatric: He has a normal mood and affect. His behavior is normal. Judgment and thought content normal.  Nursing note and vitals reviewed.     Assessment & Plan:  1. Routine general medical examination at a health care facility - Paperwork filled out releasing patient for physical fitness exam with the [facility] police department  - Basic metabolic panel - CBC with Differential/Platelet - Hepatic function panel - Lipid panel - TSH - Follow up in year or sooner if needed  Shirline Frees, NP

## 2017-08-23 ENCOUNTER — Encounter: Payer: Self-pay | Admitting: Adult Health

## 2017-08-23 ENCOUNTER — Ambulatory Visit: Payer: Managed Care, Other (non HMO) | Admitting: Adult Health

## 2017-08-23 VITALS — BP 120/74 | Temp 98.1°F | Wt 165.0 lb

## 2017-08-23 DIAGNOSIS — F419 Anxiety disorder, unspecified: Secondary | ICD-10-CM | POA: Diagnosis not present

## 2017-08-23 MED ORDER — CITALOPRAM HYDROBROMIDE 20 MG PO TABS
20.0000 mg | ORAL_TABLET | Freq: Every day | ORAL | 3 refills | Status: DC
Start: 2017-08-23 — End: 2017-09-23

## 2017-08-23 NOTE — Progress Notes (Signed)
Subjective:    Patient ID: Alex Diaz, male    DOB: [birth year], 36 y.o.   MRN: [id number]  HPI 36 year old male who  has a past medical history of GERD (gastroesophageal reflux disease) and Headache(784.0).  He presents to the office today to discuss anxiety and mood swings. He reports that he has been dealing with this for " many years" but that he always internalized his issues and would try and release his anxiety through working out and running. He feels as though his anxiety and mood swings have been becoming progressively worse. He has been having issues sleeping at night as he will wake up around 2-3 am and have constant thoughts running through his head. He denies any depression currently but has had episodes of depression when he is away from his kids, during these episodes he reports sitting in his home and drinking beer until the depression goes away. He denies any SI   Denies any CP or SOB   Review of Systems See HPI   Past Medical History:  Diagnosis Date  . GERD (gastroesophageal reflux disease)   . Headache(784.0)     Social History   Socioeconomic History  . Marital status: Married    Spouse name: Not on file  . Number of children: Not on file  . Years of education: Not on file  . Highest education level: Not on file  Social Needs  . Financial resource strain: Not on file  . Food insecurity - worry: Not on file  . Food insecurity - inability: Not on file  . Transportation needs - medical: Not on file  . Transportation needs - non-medical: Not on file  Occupational History  . Not on file  Tobacco Use  . Smoking status: Former Games developermoker  . Smokeless tobacco: Never Used  Substance and Sexual Activity  . Alcohol use: Yes    Comment: occassionally  . Drug use: No  . Sexual activity: Yes  Other Topics Concern  . Not on file  Social History Narrative  . Not on file    History reviewed. No pertinent surgical history.  Family History  Problem  Relation Age of Onset  . Hypertension Father   . Kidney disease Father   . Diabetes Paternal Aunt   . Cancer Maternal Grandmother        lung  . Diabetes Maternal Grandfather   . Heart disease Paternal Grandmother   . Stroke Paternal Grandmother     No Known Allergies  No current outpatient medications on file prior to visit.   No current facility-administered medications on file prior to visit.     BP 120/74 (BP Location: Left Arm)   Temp 98.1 F (36.7 C) (Oral)   Wt 165 lb (74.8 kg)   BMI 25.84 kg/m       Objective:   Physical Exam  Constitutional: He is oriented to person, place, and time. He appears well-developed and well-nourished. No distress.  Cardiovascular: Normal rate, regular rhythm, normal heart sounds and intact distal pulses. Exam reveals no gallop and no friction rub.  No murmur heard. Pulmonary/Chest: Effort normal and breath sounds normal. No respiratory distress. He has no wheezes. He has no rales. He exhibits no tenderness.  Neurological: He is alert and oriented to person, place, and time.  Skin: Skin is warm and dry. No rash noted. He is not diaphoretic. No erythema. No pallor.  Psychiatric: He has a normal mood and affect. His behavior is normal. Judgment  and thought content normal.  Nursing note and vitals reviewed.     Assessment & Plan:  Will start on 20 mg Celexa. Will follow up in one month or sooner if needed  Shirline Freesory Charlottie Peragine, NP

## 2017-08-23 NOTE — Patient Instructions (Signed)
I have prescribed Celexa 20 mg to help with your anxiety.   Take this daily.   Follow up in one month

## 2017-09-23 ENCOUNTER — Ambulatory Visit: Payer: Managed Care, Other (non HMO) | Admitting: Adult Health

## 2017-09-23 ENCOUNTER — Encounter: Payer: Self-pay | Admitting: Adult Health

## 2017-09-23 VITALS — BP 100/60 | Temp 98.4°F | Wt 165.0 lb

## 2017-09-23 DIAGNOSIS — F419 Anxiety disorder, unspecified: Secondary | ICD-10-CM | POA: Diagnosis not present

## 2017-09-23 DIAGNOSIS — R4586 Emotional lability: Secondary | ICD-10-CM

## 2017-09-23 MED ORDER — CITALOPRAM HYDROBROMIDE 20 MG PO TABS: 20.0000 mg | ORAL_TABLET | Freq: Every day | ORAL | 1 refills | Status: DC

## 2017-09-23 NOTE — Progress Notes (Signed)
Subjective:    Patient ID: Alex BoronChristoffer D Frerking, male    DOB: April 05, 1981, 36 y.o.   MRN: [id number]  HPI  36 year old male who  has a past medical history of GERD (gastroesophageal reflux disease) and Headache(784.0). He presents to the office today for for one month follow up after starting Celexa for anxiety and mood swings. During that session he reported that he had been dealing with his anxiety for " many years" but that he felt as though he would always internalize his issues and would release the anxiety though working out and running. He feels as though his anxiety and mood swings have been becoming progressively worse. He had also been having issues with sleeping at night; where he would wake up around 203 am and have constant thoughts running through his head   Since being on Celexa he reports significant overall improvement. Per patient " I am feeling really good! I am sleeping through the night, I no longer have anxiety and my mood swings are gone." He is only complaint is that of not being able to ejaculate all the time.   Denies any n/v/d, chest pain, or SOB   Review of Systems  See HPI    Past Medical History:  Diagnosis Date  . GERD (gastroesophageal reflux disease)   . Headache(784.0)     Social History   Socioeconomic History  . Marital status: Married    Spouse name: Not on file  . Number of children: Not on file  . Years of education: Not on file  . Highest education level: Not on file  Social Needs  . Financial resource strain: Not on file  . Food insecurity - worry: Not on file  . Food insecurity - inability: Not on file  . Transportation needs - medical: Not on file  . Transportation needs - non-medical: Not on file  Occupational History  . Not on file  Tobacco Use  . Smoking status: Former Games developermoker  . Smokeless tobacco: Never Used  Substance and Sexual Activity  . Alcohol use: Yes    Comment: occassionally  . Drug use: No  . Sexual activity: Yes    Other Topics Concern  . Not on file  Social History Narrative  . Not on file    History reviewed. No pertinent surgical history.  Family History  Problem Relation Age of Onset  . Hypertension Father   . Kidney disease Father   . Diabetes Paternal Aunt   . Cancer Maternal Grandmother        lung  . Diabetes Maternal Grandfather   . Heart disease Paternal Grandmother   . Stroke Paternal Grandmother     No Known Allergies  Current Outpatient Medications on File Prior to Visit  Medication Sig Dispense Refill  . citalopram (CELEXA) 20 MG tablet Take 1 tablet (20 mg total) daily by mouth. 30 tablet 3   No current facility-administered medications on file prior to visit.     BP 100/60 (BP Location: Left Arm)   Temp 98.4 F (36.9 C) (Other (Comment))   Wt 165 lb (74.8 kg)   BMI 25.84 kg/m       Objective:   Physical Exam  Constitutional: He is oriented to person, place, and time. He appears well-developed and well-nourished. No distress.  HENT:  Right Ear: External ear normal.  Cardiovascular: Normal rate, regular rhythm, normal heart sounds and intact distal pulses. Exam reveals no gallop and no friction rub.  No murmur heard.  Pulmonary/Chest: Effort normal and breath sounds normal. No respiratory distress. He has no wheezes. He has no rales. He exhibits no tenderness.  Musculoskeletal: Normal range of motion.  Neurological: He is alert and oriented to person, place, and time.  Skin: Skin is warm and dry. No rash noted. He is not diaphoretic. No erythema. No pallor.  Psychiatric: He has a normal mood and affect. His behavior is normal. Judgment and thought content normal.  Nursing note and vitals reviewed.     Assessment & Plan:  - Will keep on current dose of celexa. Advised that ejaculations issues will get better with time. Follow up as needed - New script for 90 days sent to pharmacy   Shirline Freesory Rondal Vandevelde, NP

## 2018-01-30 ENCOUNTER — Ambulatory Visit (INDEPENDENT_AMBULATORY_CARE_PROVIDER_SITE_OTHER): Payer: Self-pay | Admitting: Adult Health

## 2018-01-30 ENCOUNTER — Encounter: Payer: Self-pay | Admitting: Adult Health

## 2018-01-30 VITALS — BP 110/70 | Temp 97.8°F | Wt 179.0 lb

## 2018-01-30 DIAGNOSIS — N529 Male erectile dysfunction, unspecified: Secondary | ICD-10-CM

## 2018-01-30 MED ORDER — CITALOPRAM HYDROBROMIDE 10 MG PO TABS: 10.0000 mg | ORAL_TABLET | Freq: Every day | ORAL | 1 refills | Status: DC

## 2018-01-30 NOTE — Progress Notes (Signed)
Subjective:    Patient ID: Alex Diaz, male    DOB: [birth year], 37 y.o.   MRN: [id number]  HPI  37 year old male who  has a past medical history of GERD (gastroesophageal reflux disease) and Headache(784.0).  Presents to the office today to discuss ED. he reports that since starting Celexa in December 2018 has had issues with erectile dysfunction.  Reports unable to get to maintain an erection.  Very rarely does he wake up in the morning with an erection.  Review of Systems See HPI   Past Medical History:  Diagnosis Date  . GERD (gastroesophageal reflux disease)   . Headache(784.0)     Social History   Socioeconomic History  . Marital status: Married    Spouse name: Not on file  . Number of children: Not on file  . Years of education: Not on file  . Highest education level: Not on file  Occupational History  . Not on file  Social Needs  . Financial resource strain: Not on file  . Food insecurity:    Worry: Not on file    Inability: Not on file  . Transportation needs:    Medical: Not on file    Non-medical: Not on file  Tobacco Use  . Smoking status: Former Games developer  . Smokeless tobacco: Never Used  Substance and Sexual Activity  . Alcohol use: Yes    Comment: occassionally  . Drug use: No  . Sexual activity: Yes  Lifestyle  . Physical activity:    Days per week: Not on file    Minutes per session: Not on file  . Stress: Not on file  Relationships  . Social connections:    Talks on phone: Not on file    Gets together: Not on file    Attends religious service: Not on file    Active member of club or organization: Not on file    Attends meetings of clubs or organizations: Not on file    Relationship status: Not on file  . Intimate partner violence:    Fear of current or ex partner: Not on file    Emotionally abused: Not on file    Physically abused: Not on file    Forced sexual activity: Not on file  Other Topics Concern  . Not on file  Social  History Narrative  . Not on file    History reviewed. No pertinent surgical history.  Family History  Problem Relation Age of Onset  . Hypertension Father   . Kidney disease Father   . Diabetes Paternal Aunt   . Cancer Maternal Grandmother        lung  . Diabetes Maternal Grandfather   . Heart disease Paternal Grandmother   . Stroke Paternal Grandmother     No Known Allergies  No current outpatient medications on file prior to visit.   No current facility-administered medications on file prior to visit.     BP 110/70   Temp 97.8 F (36.6 C) (Oral)   Wt 179 lb (81.2 kg)   BMI 28.04 kg/m       Objective:   Physical Exam  Constitutional: He is oriented to person, place, and time. He appears well-developed and well-nourished. No distress.  Cardiovascular: Normal rate, regular rhythm, normal heart sounds and intact distal pulses. Exam reveals no gallop and no friction rub.  No murmur heard. Pulmonary/Chest: Effort normal and breath sounds normal. No respiratory distress. He has no wheezes. He has no  rales. He exhibits no tenderness.  Abdominal: Soft. Bowel sounds are normal. He exhibits no distension and no mass. There is no tenderness. There is no rebound and no guarding.  Neurological: He is alert and oriented to person, place, and time.  Skin: Skin is warm and dry. No rash noted. He is not diaphoretic. No erythema. No pallor.  Psychiatric: He has a normal mood and affect. His behavior is normal. Judgment and thought content normal.  Nursing note and vitals reviewed.     Assessment & Plan:  1. Erectile dysfunction, unspecified erectile dysfunction type -He had a normal cardiology exam in 2017.  Likely due to Celexa.  Will decrease Celexa from 20 mg to 10 mg follow-up if no improvement in 2 to 3 weeks  Shirline Freesory Marium Ragan, NP

## 2018-03-04 ENCOUNTER — Ambulatory Visit (INDEPENDENT_AMBULATORY_CARE_PROVIDER_SITE_OTHER): Payer: 59 | Admitting: Adult Health

## 2018-03-04 ENCOUNTER — Encounter: Payer: Self-pay | Admitting: Adult Health

## 2018-03-04 VITALS — BP 106/70 | Temp 97.9°F | Ht 67.75 in | Wt 168.0 lb

## 2018-03-04 DIAGNOSIS — Z0000 Encounter for general adult medical examination without abnormal findings: Secondary | ICD-10-CM | POA: Diagnosis not present

## 2018-03-04 DIAGNOSIS — F419 Anxiety disorder, unspecified: Secondary | ICD-10-CM

## 2018-03-04 LAB — CBC WITH DIFFERENTIAL/PLATELET
BASOS ABS: 0.1 10*3/uL (ref 0.0–0.1)
Basophils Relative: 0.7 % (ref 0.0–3.0)
Eosinophils Absolute: 0.3 10*3/uL (ref 0.0–0.7)
Eosinophils Relative: 4.3 % (ref 0.0–5.0)
HEMATOCRIT: 40.1 % (ref 39.0–52.0)
Hemoglobin: 13.3 g/dL (ref 13.0–17.0)
LYMPHS PCT: 31.8 % (ref 12.0–46.0)
Lymphs Abs: 2.6 10*3/uL (ref 0.7–4.0)
MCHC: 33.2 g/dL (ref 30.0–36.0)
MCV: 93.4 fl (ref 78.0–100.0)
MONOS PCT: 8 % (ref 3.0–12.0)
Monocytes Absolute: 0.6 10*3/uL (ref 0.1–1.0)
NEUTROS ABS: 4.5 10*3/uL (ref 1.4–7.7)
Neutrophils Relative %: 55.2 % (ref 43.0–77.0)
Platelets: 165 10*3/uL (ref 150.0–400.0)
RBC: 4.29 Mil/uL (ref 4.22–5.81)
RDW: 13.7 % (ref 11.5–15.5)
WBC: 8.1 10*3/uL (ref 4.0–10.5)

## 2018-03-04 LAB — HEPATIC FUNCTION PANEL
ALBUMIN: 4.4 g/dL (ref 3.5–5.2)
ALK PHOS: 59 U/L (ref 39–117)
ALT: 21 U/L (ref 0–53)
AST: 27 U/L (ref 0–37)
Bilirubin, Direct: 0 mg/dL (ref 0.0–0.3)
TOTAL PROTEIN: 7.4 g/dL (ref 6.0–8.3)
Total Bilirubin: 0.3 mg/dL (ref 0.2–1.2)

## 2018-03-04 LAB — BASIC METABOLIC PANEL
BUN: 11 mg/dL (ref 6–23)
CALCIUM: 9.4 mg/dL (ref 8.4–10.5)
CO2: 28 meq/L (ref 19–32)
Chloride: 103 mEq/L (ref 96–112)
Creatinine, Ser: 1.17 mg/dL (ref 0.40–1.50)
GFR: 91.42 mL/min (ref >60.00)
Glucose, Bld: 101 mg/dL — ABNORMAL HIGH (ref 70–99)
POTASSIUM: 4.6 meq/L (ref 3.5–5.1)
SODIUM: 139 meq/L (ref 135–145)

## 2018-03-04 LAB — LIPID PANEL
Cholesterol: 204 mg/dL — ABNORMAL HIGH (ref 0–200)
HDL: 46.2 mg/dL (ref >39.00)
LDL Cholesterol: 135 mg/dL — ABNORMAL HIGH (ref 0–99)
NONHDL: 158.2
Total CHOL/HDL Ratio: 4
Triglycerides: 118 mg/dL (ref 0.0–149.0)
VLDL: 23.6 mg/dL (ref 0.0–40.0)

## 2018-03-04 NOTE — Progress Notes (Signed)
Subjective:    Patient ID: Alex Diaz, male    DOB: 1981/09/29, 37 y.o.   MRN: [id number]  HPI  Patient presents for yearly preventative medicine examination. He is a pleasant 37 year old male who  has a past medical history of GERD (gastroesophageal reflux disease) and Headache(784.0).   Anxiety - He was prescribed Celexa 10 mg. Went on vacation about two weeks ago and forgot his medication, he did not experience any side effects and never went back on the medication. He feels as though his symptoms are now well controlled without medication.   All immunizations and health maintenance protocols were reviewed with the patient and needed orders were placed. UTD on vaccinations   Appropriate screening laboratory values were ordered for the patient including screening of hyperlipidemia, renal function and hepatic function.  Medication reconciliation,  past medical history, social history, problem list and allergies were reviewed in detail with the patient  Goals were established with regard to weight loss, exercise, and  diet in compliance with medications. He has been eating healthy and exercising on a regular basis   Wt Readings from Last 3 Encounters:  03/04/18 168 lb (76.2 kg)  01/30/18 179 lb (81.2 kg)  09/23/17 165 lb (74.8 kg)   He has no acute complaints. Reports " I am doing really well. 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   HENT: Negative.   Eyes: Negative.   Respiratory: Negative.   Cardiovascular: Negative.   Gastrointestinal: Negative.   Endocrine: Negative.   Genitourinary: Negative.   Musculoskeletal: Negative.   Skin: Negative.   Allergic/Immunologic: Negative.   Neurological: Negative.   Hematological: Negative.   Psychiatric/Behavioral: Negative.   All other systems reviewed and are negative.  Past Medical History:  Diagnosis Date  . GERD (gastroesophageal reflux disease)   .  Headache(784.0)     Social History   Socioeconomic History  . Marital status: Married    Spouse name: Not on file  . Number of children: Not on file  . Years of education: Not on file  . Highest education level: Not on file  Occupational History  . Not on file  Social Needs  . Financial resource strain: Not on file  . Food insecurity:    Worry: Not on file    Inability: Not on file  . Transportation needs:    Medical: Not on file    Non-medical: Not on file  Tobacco Use  . Smoking status: Former Games developer  . Smokeless tobacco: Never Used  Substance and Sexual Activity  . Alcohol use: Yes    Comment: occassionally  . Drug use: No  . Sexual activity: Yes  Lifestyle  . Physical activity:    Days per week: Not on file    Minutes per session: Not on file  . Stress: Not on file  Relationships  . Social connections:    Talks on phone: Not on file    Gets together: Not on file    Attends religious service: Not on file    Active member of club or organization: Not on file    Attends meetings of clubs or organizations: Not on file    Relationship status: Not on file  . Intimate partner violence:    Fear of current or ex partner: Not on file    Emotionally abused: Not on file    Physically abused: Not on file  Forced sexual activity: Not on file  Other Topics Concern  . Not on file  Social History Narrative  . Not on file    History reviewed. No pertinent surgical history.  Family History  Problem Relation Age of Onset  . Hypertension Father   . Kidney disease Father   . Diabetes Paternal Aunt   . Cancer Maternal Grandmother        lung  . Diabetes Maternal Grandfather   . Heart disease Paternal Grandmother   . Stroke Paternal Grandmother     No Known Allergies  No current outpatient medications on file prior to visit.   No current facility-administered medications on file prior to visit.     BP 106/70   Temp 97.9 F (36.6 C) (Oral)   Ht 5' 7.75"  (1.721 m)   Wt 168 lb (76.2 kg)   BMI 25.73 kg/m       Objective:   Physical Exam  Constitutional: He is oriented to person, place, and time. He appears well-developed and well-nourished. No distress.  HENT:  Head: Normocephalic and atraumatic.  Right Ear: External ear normal.  Left Ear: External ear normal.  Nose: Nose normal.  Mouth/Throat: Oropharynx is clear and moist. No oropharyngeal exudate.  Eyes: Pupils are equal, round, and reactive to light. Conjunctivae and EOM are normal. Right eye exhibits no discharge. Left eye exhibits no discharge. No scleral icterus.  Neck: Normal range of motion. Neck supple. No JVD present. No tracheal deviation present. No thyromegaly present.  Cardiovascular: Normal rate, regular rhythm, normal heart sounds and intact distal pulses. Exam reveals no gallop and no friction rub.  No murmur heard. Pulmonary/Chest: Effort normal and breath sounds normal. No stridor. No respiratory distress. He has no wheezes. He has no rales. He exhibits no tenderness.  Abdominal: Soft. Bowel sounds are normal. He exhibits no distension and no mass. There is no tenderness. There is no rebound and no guarding. No hernia.  Musculoskeletal: Normal range of motion. He exhibits no edema, tenderness or deformity.  Lymphadenopathy:    He has no cervical adenopathy.  Neurological: He is alert and oriented to person, place, and time. He displays normal reflexes. No cranial nerve deficit or sensory deficit. He exhibits normal muscle tone. Coordination normal.  Skin: Skin is warm and dry. Capillary refill takes less than 2 seconds. No rash noted. He is not diaphoretic. No erythema. No pallor.  Psychiatric: He has a normal mood and affect. His behavior is normal. Judgment and thought content normal.  Nursing note and vitals reviewed.     Assessment & Plan:  1. Routine general medical examination at a health care facility - benign exam - Continue with diet and exercise  -  Follow up in one year or sooner if needed - Basic metabolic panel - CBC with Differential/Platelet - Hepatic function panel - Lipid panel  2. Anxiety - Ok to d/c celexa - He knows to call if he feels as though he needs to restart medication   .Shirline Frees, NP

## 2018-12-19 ENCOUNTER — Ambulatory Visit: Payer: 59 | Admitting: Adult Health

## 2018-12-19 ENCOUNTER — Encounter: Payer: Self-pay | Admitting: Adult Health

## 2018-12-19 ENCOUNTER — Other Ambulatory Visit: Payer: Self-pay

## 2018-12-19 VITALS — BP 100/78 | Temp 97.6°F | Wt 183.0 lb

## 2018-12-19 DIAGNOSIS — N529 Male erectile dysfunction, unspecified: Secondary | ICD-10-CM | POA: Diagnosis not present

## 2018-12-19 NOTE — Progress Notes (Signed)
Subjective:    Patient ID: ERNESTINE UHRIG, male    DOB: 05/04/1981, 38 y.o.   MRN: [id number]  HPI  38 year old male who  has a past medical history of GERD (gastroesophageal reflux disease) and Headache(784.0).  Resents to the office today for continued issues with erectile dysfunction.  He was last seen for this issue about a year ago and at that time was thought to be due to his use of Celexa for anxiety, Celexa was cut back from 20 mg to 10 mg and he noticed some improvement in his ED issues.  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  He reports no issues with erectile dysfunction with masturbation but when he is trying to have sex with his partner he cannot get an erection.    He continues to get an erection in the morning  Denies any drug, alcohol, or tobacco use   Review of Systems See HPI   Past Medical History:  Diagnosis Date  . GERD (gastroesophageal reflux disease)   . Headache(784.0)     Social History   Socioeconomic History  . Marital status: Married    Spouse name: Not on file  . Number of children: Not on file  . Years of education: Not on file  . Highest education level: Not on file  Occupational History  . Not on file  Social Needs  . Financial resource strain: Not on file  . Food insecurity:    Worry: Not on file    Inability: Not on file  . Transportation needs:    Medical: Not on file    Non-medical: Not on file  Tobacco Use  . Smoking status: Former Games developer  . Smokeless tobacco: Never Used  Substance and Sexual Activity  . Alcohol use: Yes    Comment: occassionally  . Drug use: No  . Sexual activity: Yes  Lifestyle  . Physical activity:    Days per week: Not on file    Minutes per session: Not on file  . Stress: Not on file  Relationships  . Social connections:   Talks on phone: Not on file    Gets together: Not on file    Attends religious service: Not on file    Active member of club or organization: Not on file    Attends meetings of clubs or organizations: Not on file    Relationship status: Not on file  . Intimate partner violence:    Fear of current or ex partner: Not on file    Emotionally abused: Not on file    Physically abused: Not on file    Forced sexual activity: Not on file  Other Topics Concern  . Not on file  Social History Narrative  . Not on file    History reviewed. No pertinent surgical history.  Family History  Problem Relation Age of Onset  . Hypertension Father   . Kidney disease Father   . Diabetes Paternal Aunt   . Cancer Maternal Grandmother        lung  . Diabetes Maternal Grandfather   . Heart disease Paternal Grandmother   . Stroke Paternal Grandmother     No Known Allergies  No current outpatient medications on file prior to visit.   No current facility-administered medications on file prior to  visit.     BP 100/78   Temp 97.6 F (36.4 C)   Wt 183 lb (83 kg)   BMI 28.03 kg/m       Objective:   Physical Exam Vitals signs and nursing note reviewed.  Constitutional:      Appearance: Normal appearance.  Cardiovascular:     Rate and Rhythm: Normal rate and regular rhythm.     Pulses: Normal pulses.     Heart sounds: Normal heart sounds.  Pulmonary:     Effort: Pulmonary effort is normal.     Breath sounds: Normal breath sounds.  Skin:    General: Skin is warm and dry.  Neurological:     General: No focal deficit present.     Mental Status: He is alert and oriented to person, place, and time.  Psychiatric:        Mood and Affect: Mood normal.        Behavior: Behavior normal.        Thought Content: Thought content normal.        Judgment: Judgment normal.       Assessment & Plan:  1. Erectile dysfunction, unspecified erectile dysfunction type -Appear to be psychological in  nature since he has no issues with masturbation.  We talked about EBI therapy self pleasure before sexual intercourse. -Sitter referral to psychiatry if needed -follow-up as needed  Shirline Frees, NP

## 2019-02-09 ENCOUNTER — Other Ambulatory Visit: Payer: Self-pay | Admitting: Adult Health

## 2019-02-09 NOTE — Telephone Encounter (Signed)
SENT TO THE PHARMACY BY E-SCRIBE. 

## 2019-03-10 ENCOUNTER — Encounter: Payer: 59 | Admitting: Adult Health

## 2019-05-28 ENCOUNTER — Encounter: Payer: Self-pay | Admitting: Adult Health

## 2019-05-28 ENCOUNTER — Ambulatory Visit (INDEPENDENT_AMBULATORY_CARE_PROVIDER_SITE_OTHER): Payer: 59 | Admitting: Adult Health

## 2019-05-28 ENCOUNTER — Other Ambulatory Visit: Payer: Self-pay

## 2019-05-28 VITALS — BP 110/70 | Temp 97.9°F | Ht 68.0 in | Wt 167.0 lb

## 2019-05-28 DIAGNOSIS — F419 Anxiety disorder, unspecified: Secondary | ICD-10-CM

## 2019-05-28 DIAGNOSIS — Z0000 Encounter for general adult medical examination without abnormal findings: Secondary | ICD-10-CM

## 2019-05-28 LAB — CBC WITH DIFFERENTIAL/PLATELET
Basophils Absolute: 0 10*3/uL (ref 0.0–0.1)
Basophils Relative: 0.5 % (ref 0.0–3.0)
Eosinophils Absolute: 0.2 10*3/uL (ref 0.0–0.7)
Eosinophils Relative: 3.3 % (ref 0.0–5.0)
HCT: 42.2 % (ref 39.0–52.0)
Hemoglobin: 13.8 g/dL (ref 13.0–17.0)
Lymphocytes Relative: 28.9 % (ref 12.0–46.0)
Lymphs Abs: 2.1 10*3/uL (ref 0.7–4.0)
MCHC: 32.7 g/dL (ref 30.0–36.0)
MCV: 93 fl (ref 78.0–100.0)
Monocytes Absolute: 0.7 10*3/uL (ref 0.1–1.0)
Monocytes Relative: 10.1 % (ref 3.0–12.0)
Neutro Abs: 4.2 10*3/uL (ref 1.4–7.7)
Neutrophils Relative %: 57.2 % (ref 43.0–77.0)
Platelets: 174 10*3/uL (ref 150.0–400.0)
RBC: 4.54 Mil/uL (ref 4.22–5.81)
RDW: 13.7 % (ref 11.5–15.5)
WBC: 7.4 10*3/uL (ref 4.0–10.5)

## 2019-05-28 LAB — TSH: TSH: 0.77 u[IU]/mL (ref 0.35–4.50)

## 2019-05-28 LAB — COMPREHENSIVE METABOLIC PANEL
ALT: 21 U/L (ref 0–53)
AST: 24 U/L (ref 0–37)
Albumin: 3.7 g/dL (ref 3.5–5.2)
Alkaline Phosphatase: 49 U/L (ref 39–117)
BUN: 13 mg/dL (ref 6–23)
CO2: 29 mEq/L (ref 19–32)
Calcium: 8.6 mg/dL (ref 8.4–10.5)
Chloride: 103 mEq/L (ref 96–112)
Creatinine, Ser: 1.22 mg/dL (ref 0.40–1.50)
GFR: 81.38 mL/min (ref >60.00)
Glucose, Bld: 96 mg/dL (ref 70–99)
Potassium: 5.1 mEq/L (ref 3.5–5.1)
Sodium: 140 mEq/L (ref 135–145)
Total Bilirubin: 0.3 mg/dL (ref 0.2–1.2)
Total Protein: 5.5 g/dL — ABNORMAL LOW (ref 6.0–8.3)

## 2019-05-28 LAB — LIPID PANEL
Cholesterol: 224 mg/dL — ABNORMAL HIGH (ref 0–200)
HDL: 50.7 mg/dL (ref >39.00)
LDL Cholesterol: 154 mg/dL — ABNORMAL HIGH (ref 0–99)
NonHDL: 173.71
Total CHOL/HDL Ratio: 4
Triglycerides: 99 mg/dL (ref 0.0–149.0)
VLDL: 19.8 mg/dL (ref 0.0–40.0)

## 2019-05-28 MED ORDER — CITALOPRAM HYDROBROMIDE 10 MG PO TABS: 10.0000 mg | ORAL_TABLET | Freq: Every day | ORAL | 1 refills | Status: DC

## 2019-05-28 NOTE — Progress Notes (Signed)
Subjective:    Patient ID: Alex Diaz, male    DOB: Sep 28, 1981, 38 y.o.   MRN: [id number]  HPI  Patient presents for yearly preventative medicine examination. He is a pleasant 38 year old male who  has a past medical history of GERD (gastroesophageal reflux disease) and Headache(784.0).  H/o Anxiety - Currently prescribed Celexa 10 mg daily. He feels well controlled on this. 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  At this time he reported no issues with the ED with self pleasure but when he was trying to have sex with his partner he could not get an erection.  Continued to have morning erections.  He was in advised to try CBI therapy. Reports that this issue has resolved.   All immunizations and health maintenance protocols were reviewed with the patient and needed orders were placed.  Appropriate screening laboratory values were ordered for the patient including screening of hyperlipidemia, renal function and hepatic function.  Medication reconciliation,  past medical history, social history, problem list and allergies were reviewed in detail with the patient  Goals were established with regard to weight loss, exercise, and  diet in compliance with medications. He is eating healthy and does aerobic exercise and weight lifting multiple times a week   Wt Readings from Last 3 Encounters:  05/28/19 167 lb (75.8 kg)  12/19/18 183 lb (83 kg)  03/04/18 168 lb (76.2 kg)    End of life planning was discussed.   Review of Systems  Constitutional: Negative.   HENT: Negative.   Eyes: Negative.   Respiratory: Negative.   Cardiovascular: Negative.   Gastrointestinal: Negative.   Endocrine: Negative.   Genitourinary: Negative.   Musculoskeletal: Negative.   Skin: Negative.   Allergic/Immunologic: Negative.    Neurological: Negative.   Hematological: Negative.   Psychiatric/Behavioral: Negative.   All other systems reviewed and are negative.  Past Medical History:  Diagnosis Date  . GERD (gastroesophageal reflux disease)   . Headache(784.0)     Social History   Socioeconomic History  . Marital status: Married    Spouse name: Not on file  . Number of children: Not on file  . Years of education: Not on file  . Highest education level: Not on file  Occupational History  . Not on file  Social Needs  . Financial resource strain: Not on file  . Food insecurity    Worry: Not on file    Inability: Not on file  . Transportation needs    Medical: Not on file    Non-medical: Not on file  Tobacco Use  . Smoking status: Former Research scientist (life sciences)  . Smokeless tobacco: Never Used  Substance and Sexual Activity  . Alcohol use: Yes    Comment: occassionally  . Drug use: No  . Sexual activity: Yes  Lifestyle  . Physical activity    Days per week: Not on file    Minutes per session: Not on file  . Stress: Not on file  Relationships  . Social Herbalist on phone: Not on file    Gets together: Not on file    Attends religious service: Not on file    Active member of club or organization: Not on file    Attends meetings of clubs or organizations: Not on file  Relationship status: Not on file  . Intimate partner violence    Fear of current or ex partner: Not on file    Emotionally abused: Not on file    Physically abused: Not on file    Forced sexual activity: Not on file  Other Topics Concern  . Not on file  Social History Narrative  . Not on file    History reviewed. No pertinent surgical history.  Family History  Problem Relation Age of Onset  . Hypertension Father   . Kidney disease Father   . Diabetes Paternal Aunt   . Cancer Maternal Grandmother        lung  . Diabetes Maternal Grandfather   . Heart disease Paternal Grandmother   . Stroke Paternal Grandmother      No Known Allergies  No current outpatient medications on file prior to visit.   No current facility-administered medications on file prior to visit.     BP 110/70   Temp 97.9 F (36.6 C) (Oral)   Ht 5\' 8"  (1.727 m)   Wt 167 lb (75.8 kg)   BMI 25.39 kg/m       Objective:   Physical Exam Vitals signs and nursing note reviewed.  Constitutional:      General: He is not in acute distress.    Appearance: Normal appearance. He is well-developed. He is not diaphoretic.  HENT:     Head: Normocephalic and atraumatic.     Right Ear: Tympanic membrane, ear canal and external ear normal. There is no impacted cerumen.     Left Ear: Tympanic membrane, ear canal and external ear normal. There is no impacted cerumen.     Nose: Nose normal. No congestion or rhinorrhea.     Mouth/Throat:     Mouth: Mucous membranes are moist.     Pharynx: No oropharyngeal exudate.  Eyes:     General:        Right eye: No discharge.        Left eye: No discharge.     Conjunctiva/sclera: Conjunctivae normal.     Pupils: Pupils are equal, round, and reactive to light.  Neck:     Thyroid: No thyromegaly.     Vascular: No carotid bruit.     Trachea: No tracheal deviation.  Cardiovascular:     Rate and Rhythm: Normal rate and regular rhythm.     Pulses: Normal pulses.     Heart sounds: Normal heart sounds. No murmur. No friction rub. No gallop.   Pulmonary:     Effort: Pulmonary effort is normal. No respiratory distress.     Breath sounds: Normal breath sounds. No stridor. No wheezing, rhonchi or rales.  Chest:     Chest wall: No tenderness.  Abdominal:     General: Bowel sounds are normal. There is no distension.     Palpations: Abdomen is soft. There is no mass.     Tenderness: There is no abdominal tenderness. There is no right CVA tenderness, left CVA tenderness, guarding or rebound.     Hernia: No hernia is present.  Musculoskeletal: Normal range of motion.        General: No swelling,  tenderness, deformity or signs of injury.     Right lower leg: No edema.     Left lower leg: No edema.  Lymphadenopathy:     Cervical: No cervical adenopathy.  Skin:    General: Skin is warm and dry.     Capillary Refill: Capillary refill takes less  than 2 seconds.     Coloration: Skin is not jaundiced or pale.     Findings: No bruising, erythema, lesion or rash.  Neurological:     General: No focal deficit present.     Mental Status: He is alert and oriented to person, place, and time. Mental status is at baseline.     Cranial Nerves: No cranial nerve deficit.     Sensory: No sensory deficit.     Motor: No weakness.     Coordination: Coordination normal.     Gait: Gait normal.     Deep Tendon Reflexes: Reflexes normal.  Psychiatric:        Mood and Affect: Mood normal.        Behavior: Behavior normal.        Thought Content: Thought content normal.        Judgment: Judgment normal.       Assessment & Plan:  1. Routine general medical examination at a health care facility - Benign exam  - Continue to stay active and exercise - Follow up in one year or sooner if needed - CBC with Differential/Platelet - Comprehensive metabolic panel - Lipid panel - TSH  2. Anxiety - Follow up in May 2021  - citalopram (CELEXA) 10 MG tablet; Take 1 tablet (10 mg total) by mouth daily.  Dispense: 90 tablet; Refill: 1   Shirline Freesory Zalea Pete, NP

## 2019-05-29 ENCOUNTER — Other Ambulatory Visit: Payer: Self-pay | Admitting: Family Medicine

## 2019-05-29 MED ORDER — ATORVASTATIN CALCIUM 20 MG PO TABS: 20.0000 mg | ORAL_TABLET | Freq: Every day | ORAL | 3 refills | Status: DC

## 2019-05-29 NOTE — Telephone Encounter (Signed)
Sent to the pharmacy by e-scribe. 

## 2019-09-08 ENCOUNTER — Encounter: Payer: Self-pay | Admitting: Adult Health

## 2019-11-13 ENCOUNTER — Encounter (HOSPITAL_COMMUNITY): Payer: Self-pay

## 2019-11-13 ENCOUNTER — Ambulatory Visit (HOSPITAL_COMMUNITY)
Admission: EM | Admit: 2019-11-13 | Discharge: 2019-11-13 | Disposition: A | Discharge status: Home / Self Care | Payer: 59 | Attending: Family Medicine | Admitting: Family Medicine

## 2019-11-13 ENCOUNTER — Other Ambulatory Visit: Payer: Self-pay

## 2019-11-13 DIAGNOSIS — R197 Diarrhea, unspecified: Secondary | ICD-10-CM | POA: Diagnosis not present

## 2019-11-13 DIAGNOSIS — Z79899 Other long term (current) drug therapy: Secondary | ICD-10-CM | POA: Diagnosis not present

## 2019-11-13 DIAGNOSIS — Z87891 Personal history of nicotine dependence: Secondary | ICD-10-CM | POA: Diagnosis not present

## 2019-11-13 DIAGNOSIS — R112 Nausea with vomiting, unspecified: Secondary | ICD-10-CM | POA: Diagnosis not present

## 2019-11-13 DIAGNOSIS — Z20822 Contact with and (suspected) exposure to covid-19: Secondary | ICD-10-CM | POA: Diagnosis not present

## 2019-11-13 NOTE — Discharge Instructions (Signed)
This is most likely some sort of viral illness Drink fluids and make sure you are staying hydrated.  Advance diet as tolerated. Follow up as needed for continued or worsening symptoms

## 2019-11-13 NOTE — ED Triage Notes (Signed)
N/v/d since last night. Vomiting x 4.  Temp went up to 100 at home. Took Tylenol yesterday.

## 2019-11-13 NOTE — ED Provider Notes (Signed)
[place] [facility]    CSN: [id number] Arrival date & time: 11/13/19  1305      History   Chief Complaint Chief Complaint  Patient presents with  . Emesis  . Diarrhea    HPI Alex Diaz is a 39 y.o. male.   Pt is a 39 year old male that presents with N,V,D since last night. Non bilious, non bloody vomiting x 4. This has subsided. Temp of 100 at home but no fever now. Took Tylenol yesterday. Has been sipping fluids. Currently feeling better. No abdominal pain, back pain, urinary or respiratory symptoms. No recent sick contact or recent travels.   ROS per HPI      Past Medical History:  Diagnosis Date  . GERD (gastroesophageal reflux disease)   . QIONGEXB(284.1)     Patient Active Problem List   Diagnosis Date Noted  . GERD (gastroesophageal reflux disease) 12/13/2012    History reviewed. No pertinent surgical history.     Home Medications    Prior to Admission medications   Medication Sig Start Date End Date Taking? Authorizing Provider  atorvastatin (LIPITOR) 20 MG tablet Take 1 tablet (20 mg total) by mouth daily. 05/29/19  Yes Nafziger, Tommi Rumps, NP  citalopram (CELEXA) 10 MG tablet Take 1 tablet (10 mg total) by mouth daily. 05/28/19  Yes Nafziger, Tommi Rumps, NP    Family History Family History  Problem Relation Age of Onset  . Hypertension Father   . Kidney disease Father   . Diabetes Paternal Aunt   . Cancer Maternal Grandmother        lung  . Diabetes Maternal Grandfather   . Heart disease Paternal Grandmother   . Stroke Paternal Grandmother     Social History Social History   Tobacco Use  . Smoking status: Former Research scientist (life sciences)  . Smokeless tobacco: Never Used  Substance Use Topics  . Alcohol use: Yes    Comment: occassionally  . Drug use: No     Allergies   Patient has no known allergies.   Review of Systems Review of Systems   Physical Exam Triage Vital Signs ED Triage Vitals  Enc Vitals Group     BP 11/13/19 1347 108/69   Pulse Rate 11/13/19 1347 96     Resp 11/13/19 1347 18     Temp 11/13/19 1347 97.7 F (36.5 C)     Temp Source 11/13/19 1347 Oral     SpO2 11/13/19 1347 100 %     Weight --      Height --      Head Circumference --      Peak Flow --      Pain Score 11/13/19 1345 0     Pain Loc --      Pain Edu? --      Excl. in [place]? --    No data found.  Updated Vital Signs BP 108/69 (BP Location: Right Arm)   Pulse 96   Temp 97.7 F (36.5 C) (Oral)   Resp 18   SpO2 100%   Visual Acuity Right Eye Distance:   Left Eye Distance:   Bilateral Distance:    Right Eye Near:   Left Eye Near:    Bilateral Near:     Physical Exam Vitals and nursing note reviewed.  Constitutional:      Appearance: Normal appearance.  HENT:     Head: Normocephalic and atraumatic.     Nose: Nose normal.  Eyes:     Conjunctiva/sclera: Conjunctivae normal.  Pulmonary:     Effort: Pulmonary effort is normal.  Musculoskeletal:        General: Normal range of motion.     Cervical back: Normal range of motion.  Skin:    General: Skin is warm and dry.  Neurological:     Mental Status: He is alert.  Psychiatric:        Mood and Affect: Mood normal.      UC Treatments / Results  Labs (all labs ordered are listed, but only abnormal results are displayed) Labs Reviewed  NOVEL CORONAVIRUS, NAA (HOSP ORDER, SEND-OUT TO REF LAB; TAT 18-24 HRS)    EKG   Radiology No results found.  Procedures Procedures (including critical care time)  Medications Ordered in UC Medications - No data to display  Initial Impression / Assessment and Plan / UC Course  I have reviewed the triage vital signs and the nursing notes.  Pertinent labs & imaging results that were available during my care of the patient were reviewed by me and considered in my medical decision making (see chart for details).     N,V,D- symptoms are improved and no concerning signs or symptoms. Vitals normal.  Most likely viral  Rest, drinks  fluids Follow up as needed for continued or worsening symptoms  Final Clinical Impressions(s) / UC Diagnoses   Final diagnoses:  Nausea vomiting and diarrhea     Discharge Instructions     This is most likely some sort of viral illness Drink fluids and make sure you are staying hydrated.  Advance diet as tolerated. Follow up as needed for continued or worsening symptoms     ED Prescriptions    None     PDMP not reviewed this encounter.   Dahlia Byes A, NP 11/15/19 1150

## 2019-11-14 LAB — NOVEL CORONAVIRUS, NAA (HOSP ORDER, SEND-OUT TO REF LAB; TAT 18-24 HRS): SARS-CoV-2, NAA: NOT DETECTED

## 2019-11-24 ENCOUNTER — Encounter: Payer: Self-pay | Admitting: Adult Health

## 2019-12-09 ENCOUNTER — Telehealth: Payer: Self-pay | Admitting: Adult Health

## 2019-12-09 NOTE — Telephone Encounter (Signed)
Left a message for a return call.  Pt will need a virtual visit to discuss with [facility].

## 2019-12-09 NOTE — Telephone Encounter (Signed)
Pt is requesting to up the dosage on his Celexa.  [facility]

## 2019-12-10 NOTE — Telephone Encounter (Signed)
Left a message for a return call.

## 2019-12-11 NOTE — Telephone Encounter (Signed)
Left a message for a return call.  Have tried multiple times to reach the pt to schedule him for a virtual visit.  Will now close note.

## 2020-02-02 ENCOUNTER — Other Ambulatory Visit: Payer: Self-pay

## 2020-02-03 ENCOUNTER — Ambulatory Visit (INDEPENDENT_AMBULATORY_CARE_PROVIDER_SITE_OTHER): Payer: 59 | Admitting: Adult Health

## 2020-02-03 ENCOUNTER — Encounter: Payer: Self-pay | Admitting: Adult Health

## 2020-02-03 VITALS — BP 110/80 | Temp 97.8°F | Wt 176.0 lb

## 2020-02-03 DIAGNOSIS — F419 Anxiety disorder, unspecified: Secondary | ICD-10-CM | POA: Diagnosis not present

## 2020-02-03 DIAGNOSIS — E1169 Type 2 diabetes mellitus with other specified complication: Secondary | ICD-10-CM

## 2020-02-03 DIAGNOSIS — E782 Mixed hyperlipidemia: Secondary | ICD-10-CM | POA: Diagnosis not present

## 2020-02-03 MED ORDER — CITALOPRAM HYDROBROMIDE 20 MG PO TABS: 20.0000 mg | ORAL_TABLET | Freq: Every day | ORAL | 1 refills | Status: DC

## 2020-02-03 MED ORDER — ATORVASTATIN CALCIUM 20 MG PO TABS: 20.0000 mg | ORAL_TABLET | Freq: Every day | ORAL | 1 refills | Status: DC

## 2020-02-03 NOTE — Progress Notes (Addendum)
Subjective:    Patient ID: Alex Diaz, male    DOB: 1981/07/27, 39 y.o.   MRN: [id number]  HPI 39 year old male who  has a past medical history of GERD (gastroesophageal reflux disease) and Headache(784.0).  He presents to the office today for medication management.  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  He is currently prescribed Celexa 10 mg for anxiety, feels as though as time gets closer to his appointment date that he is becoming more anxious, he would like to increase to 20 mg before he leaves.  He also needs to have a refill of his Lipitor for 90 days.  Review of Systems See HPI   Past Medical History:  Diagnosis Date  . GERD (gastroesophageal reflux disease)   . Headache(784.0)     Social History   Socioeconomic History  . Marital status: Single    Spouse name: Not on file  . Number of children: Not on file  . Years of education: Not on file  . Highest education level: Not on file  Occupational History  . Not on file  Tobacco Use  . Smoking status: Former Research scientist (life sciences)  . Smokeless tobacco: Never Used  Substance and Sexual Activity  . Alcohol use: Yes    Comment: occassionally  . Drug use: No  . Sexual activity: Yes  Other Topics Concern  . Not on file  Social History Narrative  . Not on file   Social Determinants of Health   Financial Resource Strain:   . Difficulty of Paying Living Expenses:   Food Insecurity:   . Worried About Charity fundraiser in the Last Year:   . Arboriculturist in the Last Year:   Transportation Needs:   . Film/video editor (Medical):   Marland Kitchen Lack of Transportation (Non-Medical):   Physical Activity:   . Days of Exercise per Week:   . Minutes of Exercise per Session:   Stress:   . Feeling of Stress :   Social Connections:   . Frequency of Communication with Friends and Family:   . Frequency of Social Gatherings with Friends and Family:   . Attends Religious  Services:   . Active Member of Clubs or Organizations:   . Attends Archivist Meetings:   Marland Kitchen Marital Status:   Intimate Partner Violence:   . Fear of Current or Ex-Partner:   . Emotionally Abused:   Marland Kitchen Physically Abused:   . Sexually Abused:     History reviewed. No pertinent surgical history.  Family History  Problem Relation Age of Onset  . Hypertension Father   . Kidney disease Father   . Diabetes Paternal Aunt   . Cancer Maternal Grandmother        lung  . Diabetes Maternal Grandfather   . Heart disease Paternal Grandmother   . Stroke Paternal Grandmother     No Known Allergies  Current Outpatient Medications on File Prior to Visit  Medication Sig Dispense Refill  . atorvastatin (LIPITOR) 20 MG tablet Take 1 tablet (20 mg total) by mouth daily. 90 tablet 3  . citalopram (CELEXA) 10 MG tablet Take 1 tablet (10 mg total) by mouth daily. 90 tablet 1   No current facility-administered medications on file prior to visit.    BP 110/80   Temp 97.8 F (36.6 C)   Wt 176 lb (79.8 kg)   BMI 26.76 kg/m  Objective:   Physical Exam Vitals and nursing note reviewed.  Constitutional:      Appearance: Normal appearance.  Cardiovascular:     Rate and Rhythm: Normal rate and regular rhythm.     Pulses: Normal pulses.     Heart sounds: Normal heart sounds.  Pulmonary:     Effort: Pulmonary effort is normal.     Breath sounds: Normal breath sounds.  Musculoskeletal:        General: Normal range of motion.  Skin:    General: Skin is warm and dry.  Neurological:     General: No focal deficit present.     Mental Status: He is alert and oriented to person, place, and time.  Psychiatric:        Mood and Affect: Mood normal.        Behavior: Behavior normal.        Thought Content: Thought content normal.        Judgment: Judgment normal.       Assessment & Plan:  1. Anxiety -I am okay with increasing Celexa from 10 mg to 20 mg daily.  We will see him  back after his deployment. Advised to follow up PRN if he needs anything before he leaves - citalopram (CELEXA) 20 MG tablet; Take 1 tablet (20 mg total) by mouth daily.  Dispense: 90 tablet; Refill: 1   2. Mixed hyperlipidemia - atorvastatin (LIPITOR) 20 MG tablet; Take 1 tablet (20 mg total) by mouth daily.  Dispense: 90 tablet; Refill: 1   Shirline Frees, NP

## 2020-02-09 ENCOUNTER — Telehealth: Payer: Self-pay | Admitting: Adult Health

## 2020-02-09 ENCOUNTER — Encounter: Payer: Self-pay | Admitting: Adult Health

## 2020-02-09 NOTE — Telephone Encounter (Signed)
I have no idea what that is.

## 2020-02-09 NOTE — Telephone Encounter (Signed)
Pt was in the office last week and forgot to ask [facility] for a shaving profile for the [employer]. 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 Thanks

## 2020-02-09 NOTE — Telephone Encounter (Signed)
Called the pt and attempted to leave a message X2.  Each time I would try to leave a message, I would head an automated recording stating that I have not left any information and to leave information after the tone.  Will try again at a later time.

## 2020-02-11 NOTE — Telephone Encounter (Signed)
Left a message for a return call.

## 2020-02-12 NOTE — Telephone Encounter (Signed)
Left a message for a return call.  I have tried reaching the pt multiple times.  Will now close the note.

## 2020-03-21 ENCOUNTER — Encounter: Payer: Self-pay | Admitting: Adult Health

## 2020-03-22 NOTE — Telephone Encounter (Signed)
Printed and placed in Cory's red folder.

## 2020-12-12 ENCOUNTER — Telehealth: Payer: Self-pay | Admitting: Adult Health

## 2020-12-12 NOTE — Telephone Encounter (Signed)
FR[facility] Release of Medical Information to be filled out--placed in dr's folder.  Fax to: [phone], Attn: [facility] upon completion.

## 2020-12-14 NOTE — Telephone Encounter (Signed)
In your folder outside your door

## 2020-12-14 NOTE — Telephone Encounter (Signed)
I left pt a voicemail letting him know there are some spots he needs to sign. Asked for him to call the office back to let us know if he wants to come by and sign it or wait until his appointment with [facility] next week.

## 2020-12-14 NOTE — Telephone Encounter (Signed)
Pt comes in on 3/15

## 2020-12-14 NOTE — Telephone Encounter (Signed)
Form filled out but there are spots he needs to sign.   Does he want me to put it at the front desk or hold it until he comes in next week?

## 2020-12-19 ENCOUNTER — Other Ambulatory Visit: Payer: Self-pay

## 2020-12-20 ENCOUNTER — Encounter: Payer: Self-pay | Admitting: Adult Health

## 2020-12-20 ENCOUNTER — Other Ambulatory Visit: Payer: Self-pay | Admitting: Adult Health

## 2020-12-20 ENCOUNTER — Ambulatory Visit (INDEPENDENT_AMBULATORY_CARE_PROVIDER_SITE_OTHER): Admitting: Adult Health

## 2020-12-20 VITALS — BP 115/80 | HR 72 | Temp 97.8°F | Ht 67.0 in | Wt 176.6 lb

## 2020-12-20 DIAGNOSIS — M545 Low back pain, unspecified: Secondary | ICD-10-CM

## 2020-12-20 DIAGNOSIS — E782 Mixed hyperlipidemia: Secondary | ICD-10-CM | POA: Diagnosis not present

## 2020-12-20 DIAGNOSIS — Z0000 Encounter for general adult medical examination without abnormal findings: Secondary | ICD-10-CM

## 2020-12-20 DIAGNOSIS — F419 Anxiety disorder, unspecified: Secondary | ICD-10-CM

## 2020-12-20 LAB — CBC WITH DIFFERENTIAL/PLATELET
Basophils Absolute: 0 10*3/uL (ref 0.0–0.1)
Basophils Relative: 0.4 % (ref 0.0–3.0)
Eosinophils Absolute: 0.3 10*3/uL (ref 0.0–0.7)
Eosinophils Relative: 3.2 % (ref 0.0–5.0)
HCT: 42.3 % (ref 39.0–52.0)
Hemoglobin: 14 g/dL (ref 13.0–17.0)
Lymphocytes Relative: 28.7 % (ref 12.0–46.0)
Lymphs Abs: 2.3 10*3/uL (ref 0.7–4.0)
MCHC: 33 g/dL (ref 30.0–36.0)
MCV: 92.9 fl (ref 78.0–100.0)
Monocytes Absolute: 0.6 10*3/uL (ref 0.1–1.0)
Monocytes Relative: 7.1 % (ref 3.0–12.0)
Neutro Abs: 4.9 10*3/uL (ref 1.4–7.7)
Neutrophils Relative %: 60.6 % (ref 43.0–77.0)
Platelets: 180 10*3/uL (ref 150.0–400.0)
RBC: 4.55 Mil/uL (ref 4.22–5.81)
RDW: 14.3 % (ref 11.5–15.5)
WBC: 8.1 10*3/uL (ref 4.0–10.5)

## 2020-12-20 LAB — COMPREHENSIVE METABOLIC PANEL
ALT: 21 U/L (ref 0–53)
AST: 24 U/L (ref 0–37)
Albumin: 4.6 g/dL (ref 3.5–5.2)
Alkaline Phosphatase: 79 U/L (ref 39–117)
BUN: 12 mg/dL (ref 6–23)
CO2: 32 mEq/L (ref 19–32)
Calcium: 9.6 mg/dL (ref 8.4–10.5)
Chloride: 101 mEq/L (ref 96–112)
Creatinine, Ser: 1.11 mg/dL (ref 0.40–1.50)
GFR: 84.86 mL/min (ref >60.00)
Glucose, Bld: 94 mg/dL (ref 70–99)
Potassium: 4.7 mEq/L (ref 3.5–5.1)
Sodium: 138 mEq/L (ref 135–145)
Total Bilirubin: 0.4 mg/dL (ref 0.2–1.2)
Total Protein: 7.8 g/dL (ref 6.0–8.3)

## 2020-12-20 LAB — LIPID PANEL
Cholesterol: 180 mg/dL (ref 0–200)
HDL: 53.9 mg/dL (ref >39.00)
LDL Cholesterol: 107 mg/dL — ABNORMAL HIGH (ref 0–99)
NonHDL: 126.15
Total CHOL/HDL Ratio: 3
Triglycerides: 97 mg/dL (ref 0.0–149.0)
VLDL: 19.4 mg/dL (ref 0.0–40.0)

## 2020-12-20 LAB — TSH: TSH: 2.61 u[IU]/mL (ref 0.35–4.50)

## 2020-12-20 MED ORDER — METHYLPREDNISOLONE 4 MG PO TBPK: ORAL_TABLET | ORAL | 0 refills | Status: DC

## 2020-12-20 MED ORDER — CYCLOBENZAPRINE HCL 10 MG PO TABS: 10.0000 mg | ORAL_TABLET | Freq: Three times a day (TID) | ORAL | 0 refills | Status: DC | PRN
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
## 2021-05-12 ENCOUNTER — Telehealth: Payer: Self-pay | Admitting: Adult Health

## 2021-05-12 ENCOUNTER — Other Ambulatory Visit (HOSPITAL_COMMUNITY): Payer: Self-pay

## 2021-05-12 DIAGNOSIS — F419 Anxiety disorder, unspecified: Secondary | ICD-10-CM

## 2021-05-12 MED ORDER — CITALOPRAM HYDROBROMIDE 20 MG PO TABS
20.0000 mg | ORAL_TABLET | Freq: Every day | ORAL | 1 refills | Status: DC
  Filled 2021-05-12: qty 90, 90d supply, fill #0
  Filled 2021-12-06: qty 90, 90d supply, fill #1

## 2021-05-12 NOTE — Telephone Encounter (Signed)
PT would like a refill of his citalopram (CELEXA) 20 MG tablet sent into the [facility] on [address], [place], [place] [address].

## 2021-05-12 NOTE — Telephone Encounter (Signed)
Rx sent to pt's pharmacy

## 2021-05-12 NOTE — Addendum Note (Signed)
Addended by: Christy Sartorius on: 05/12/2021 04:51 PM   Modules accepted: Orders

## 2021-09-27 ENCOUNTER — Other Ambulatory Visit: Payer: Self-pay | Admitting: Adult Health

## 2021-09-27 DIAGNOSIS — M545 Low back pain, unspecified: Secondary | ICD-10-CM

## 2021-09-28 ENCOUNTER — Other Ambulatory Visit (HOSPITAL_COMMUNITY): Payer: Self-pay

## 2021-09-28 MED ORDER — CYCLOBENZAPRINE HCL 10 MG PO TABS
10.0000 mg | ORAL_TABLET | Freq: Three times a day (TID) | ORAL | 0 refills | Status: DC | PRN
  Filled 2021-09-28 – 2021-10-12 (×2): qty 30, 10d supply, fill #0

## 2021-10-06 ENCOUNTER — Other Ambulatory Visit (HOSPITAL_COMMUNITY): Payer: Self-pay

## 2021-10-12 ENCOUNTER — Other Ambulatory Visit (HOSPITAL_COMMUNITY): Payer: Self-pay

## 2021-10-17 ENCOUNTER — Encounter: Payer: Self-pay | Admitting: Adult Health

## 2021-10-17 ENCOUNTER — Ambulatory Visit (INDEPENDENT_AMBULATORY_CARE_PROVIDER_SITE_OTHER): Admitting: Adult Health

## 2021-10-17 VITALS — BP 120/82 | HR 92 | Temp 98.5°F | Ht 67.0 in | Wt 182.0 lb

## 2021-10-17 DIAGNOSIS — M5441 Lumbago with sciatica, right side: Secondary | ICD-10-CM

## 2021-10-17 MED ORDER — KETOROLAC TROMETHAMINE 60 MG/2ML IM SOLN
60.0000 mg | Freq: Once | INTRAMUSCULAR | Status: AC
  Administered 2021-10-17: 60 mg via INTRAMUSCULAR

## 2021-10-17 MED ORDER — METHYLPREDNISOLONE ACETATE 80 MG/ML IJ SUSP
80.0000 mg | Freq: Once | INTRAMUSCULAR | Status: AC
  Administered 2021-10-17: 80 mg via INTRAMUSCULAR

## 2021-10-17 NOTE — Patient Instructions (Signed)
It was great seeing you today   Your exam is consistent with a muscle strain   We gave you a shot of prednisone and a an antiinflammatory medication called Tramadol   Continue with heat and stretching.   You can also use Motrin 600 mg every 6 hours as needed

## 2021-10-17 NOTE — Progress Notes (Signed)
Subjective:    Patient ID: Alex Diaz, male    DOB: 1981/01/17, 41 y.o.   MRN: [id number]  HPI  41 year old male who  has a past medical history of GERD (gastroesophageal reflux disease) and Headache(784.0).  He presents to the office today for an acute issue. Reports last week he was getting out of the shower and reached down to dry his feet and he felt a sharp clenching pain in his low back and radiating down the outside of his right leg.   Three days ago he went to UC and was prescribed prednisone taper and Robaxin.   He reports that when he takes the prednisone it helps for a short period of time. He has not tried Robaxin as it was not ready for pick up yet.   He has been using a heating pad and hot using hot showers which help to some degree.    Review of Systems See HPI   Past Medical History:  Diagnosis Date   GERD (gastroesophageal reflux disease)    Headache(784.0)     Social History   Socioeconomic History   Marital status: Single    Spouse name: Not on file   Number of children: Not on file   Years of education: Not on file   Highest education level: Not on file  Occupational History   Not on file  Tobacco Use   Smoking status: Former   Smokeless tobacco: Never  Substance and Sexual Activity   Alcohol use: Yes    Comment: occassionally   Drug use: No   Sexual activity: Yes  Other Topics Concern   Not on file  Social History Narrative   Not on file   Social Determinants of Health   Financial Resource Strain: Not on file  Food Insecurity: Not on file  Transportation Needs: Not on file  Physical Activity: Not on file  Stress: Not on file  Social Connections: Not on file  Intimate Partner Violence: Not on file    History reviewed. No pertinent surgical history.  Family History  Problem Relation Age of Onset   Hypertension Father    Kidney disease Father    Diabetes Paternal Aunt    Cancer Maternal Grandmother        lung    Diabetes Maternal Grandfather    Heart disease Paternal Grandmother    Stroke Paternal Grandmother     No Known Allergies  Current Outpatient Medications on File Prior to Visit  Medication Sig Dispense Refill   atorvastatin (LIPITOR) 20 MG tablet Take 1 tablet (20 mg total) by mouth daily. 90 tablet 1   citalopram (CELEXA) 20 MG tablet Take 1 tablet (20 mg total) by mouth daily. 90 tablet 1   cyclobenzaprine (FLEXERIL) 10 MG tablet TAKE 1 TABLET BY MOUTH 3 TIMES DAILY AS NEEDED FOR MUSCLE SPASMS 30 tablet 0   methylPREDNISolone (MEDROL DOSEPAK) 4 MG TBPK tablet TAKE ALL 6 TABLETS BY MOUTH ON DAY 1, THEN DECREASE BY ONE TABLET EACH DAY (6-5-4-3-2-1) 21 each 0   methocarbamol (ROBAXIN) 750 MG tablet Take 750 mg by mouth daily.     No current facility-administered medications on file prior to visit.    BP 120/82    Pulse 92    Temp 98.5 F (36.9 C) (Oral)    Ht 5\' 7"  (1.702 m)    Wt 182 lb (82.6 kg)    SpO2 96%    BMI 28.51 kg/m  Objective:   Physical Exam Vitals and nursing note reviewed.  Constitutional:      Appearance: Normal appearance.  Cardiovascular:     Heart sounds: Normal heart sounds.  Musculoskeletal:        General: Tenderness present.     Lumbar back: Spasms and tenderness present. No bony tenderness. Decreased range of motion.     Comments: Tight right sided lower lumbar paraspinal muscles. Has loss of ROM with bending and twisting at waist.   Skin:    Capillary Refill: Capillary refill takes less than 2 seconds.  Neurological:     General: No focal deficit present.     Mental Status: He is alert and oriented to person, place, and time.  Psychiatric:        Mood and Affect: Mood normal.        Behavior: Behavior normal.        Thought Content: Thought content normal.      Assessment & Plan:  1. Acute right-sided low back pain with right-sided sciatica - Muscular in nature.  - Advised massage therapy  - Continue with heat and gentle stretching  therapy  - methylPREDNISolone acetate (DEPO-MEDROL) injection 80 mg - ketorolac (TORADOL) injection 60 mg  Shirline Frees, NP

## 2021-10-18 ENCOUNTER — Encounter: Payer: Self-pay | Admitting: Adult Health

## 2021-10-18 ENCOUNTER — Telehealth: Payer: Self-pay | Admitting: Adult Health

## 2021-10-18 NOTE — Telephone Encounter (Signed)
Patient called to follow up on whether note was corrected or not. I let him know that message had been sent back to [facility] and we were waiting on him to respond. Patient would like a callback when note is completed.      Please advise

## 2021-10-18 NOTE — Telephone Encounter (Signed)
Pt received work note on 10/17/21.  Work note had September instead of January date on it.  He needs this corrected today so he can take it to his job tonight.

## 2021-10-19 NOTE — Telephone Encounter (Signed)
Letter has been placed for pt to return to work on 16th.

## 2021-11-21 ENCOUNTER — Other Ambulatory Visit: Payer: Self-pay | Admitting: Adult Health

## 2021-11-21 DIAGNOSIS — M545 Low back pain, unspecified: Secondary | ICD-10-CM

## 2021-11-22 ENCOUNTER — Other Ambulatory Visit (HOSPITAL_COMMUNITY): Payer: Self-pay

## 2021-11-22 MED ORDER — CYCLOBENZAPRINE HCL 10 MG PO TABS
10.0000 mg | ORAL_TABLET | Freq: Three times a day (TID) | ORAL | 0 refills | Status: DC | PRN
  Filled 2021-11-22: qty 30, 10d supply, fill #0

## 2021-11-22 MED ORDER — CYCLOBENZAPRINE HCL 10 MG PO TABS: 10.0000 mg | ORAL_TABLET | Freq: Three times a day (TID) | ORAL | 0 refills | Status: DC | PRN

## 2021-11-22 NOTE — Addendum Note (Signed)
Addended by: Waymon Amato R on: 11/22/2021 08:34 AM   Modules accepted: Orders

## 2021-11-22 NOTE — Addendum Note (Signed)
Addended by: Nancy Fetter on: 11/22/2021 09:31 AM   Modules accepted: Orders

## 2021-11-24 ENCOUNTER — Ambulatory Visit (INDEPENDENT_AMBULATORY_CARE_PROVIDER_SITE_OTHER)

## 2021-11-24 ENCOUNTER — Ambulatory Visit

## 2021-11-24 ENCOUNTER — Ambulatory Visit (INDEPENDENT_AMBULATORY_CARE_PROVIDER_SITE_OTHER): Admitting: Adult Health

## 2021-11-24 ENCOUNTER — Other Ambulatory Visit: Payer: Self-pay

## 2021-11-24 VITALS — BP 100/80 | HR 91 | Temp 98.7°F | Ht 67.0 in | Wt 181.0 lb

## 2021-11-24 DIAGNOSIS — M5441 Lumbago with sciatica, right side: Secondary | ICD-10-CM

## 2021-11-24 IMAGING — DX DG LUMBAR SPINE COMPLETE 4+V
5 series · 5 of 5 positions shown · non-contrast
Comparison: None.

CLINICAL DATA: Right-sided sciatica

EXAM:
LUMBAR SPINE - COMPLETE 4+ VIEW

[lumbar spine ap]
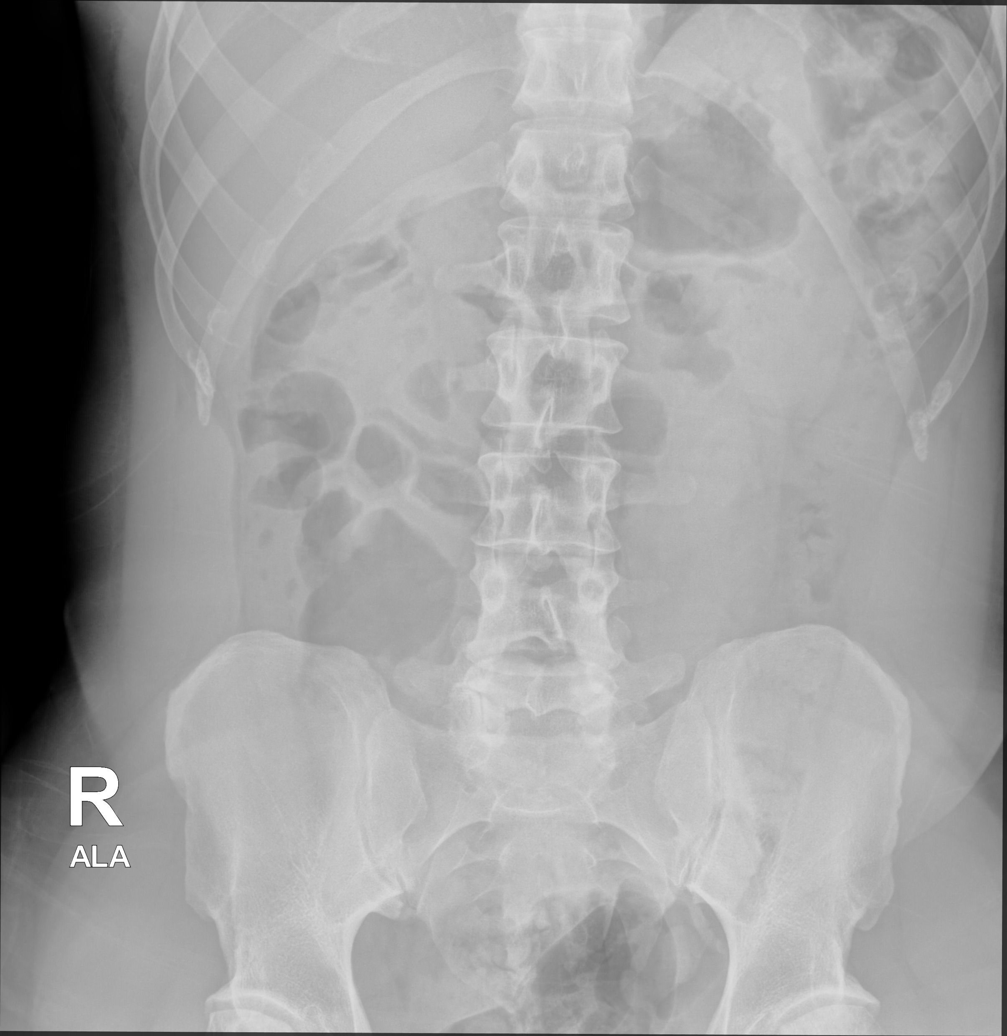

[lumbar spine oblique (1 of 2)]
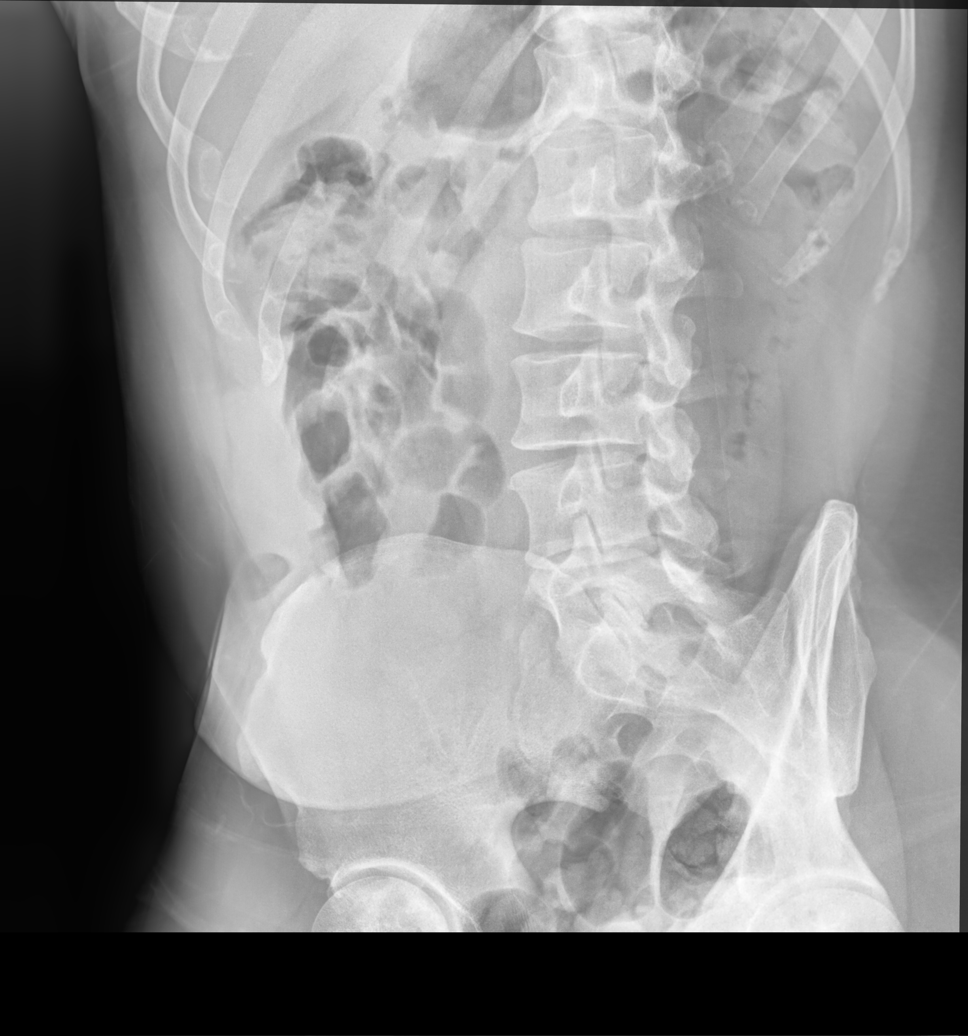

[lumbar spine oblique (2 of 2)]
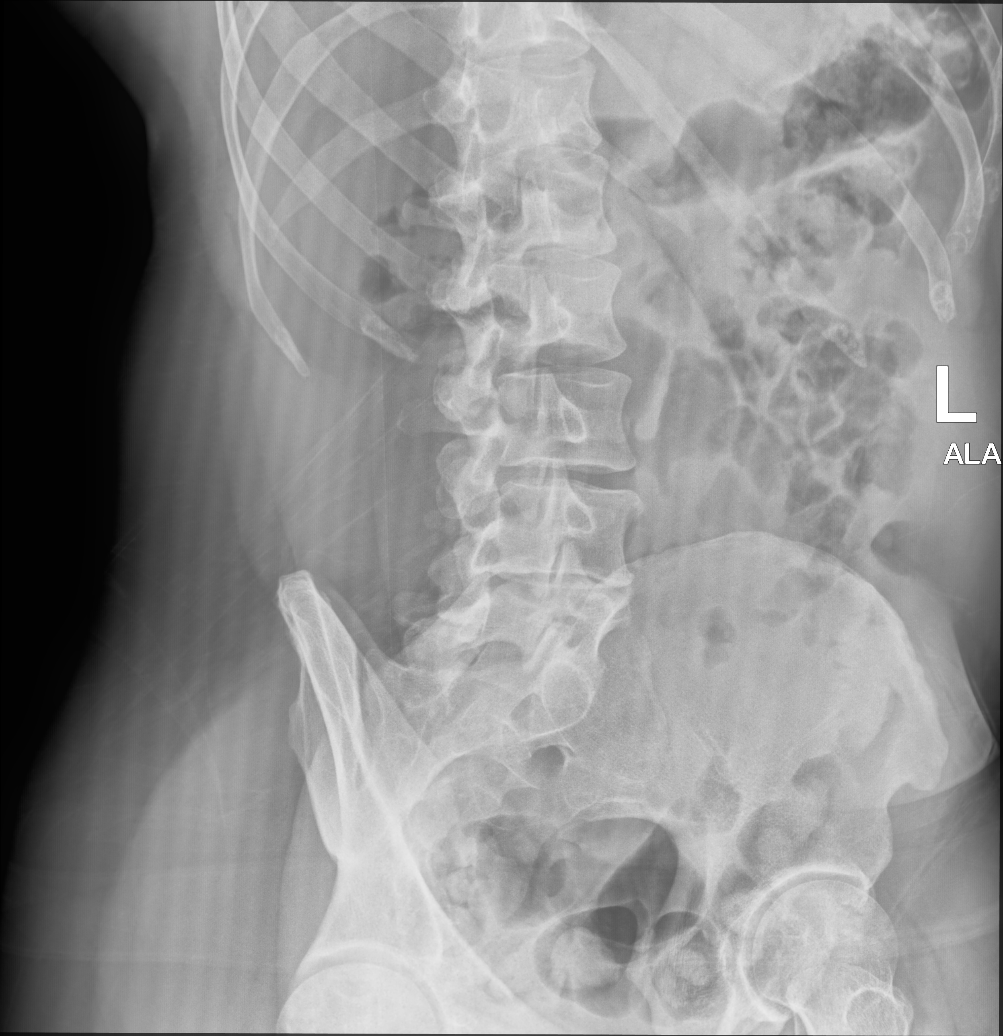

[lumbar spine lat (1 of 2)]
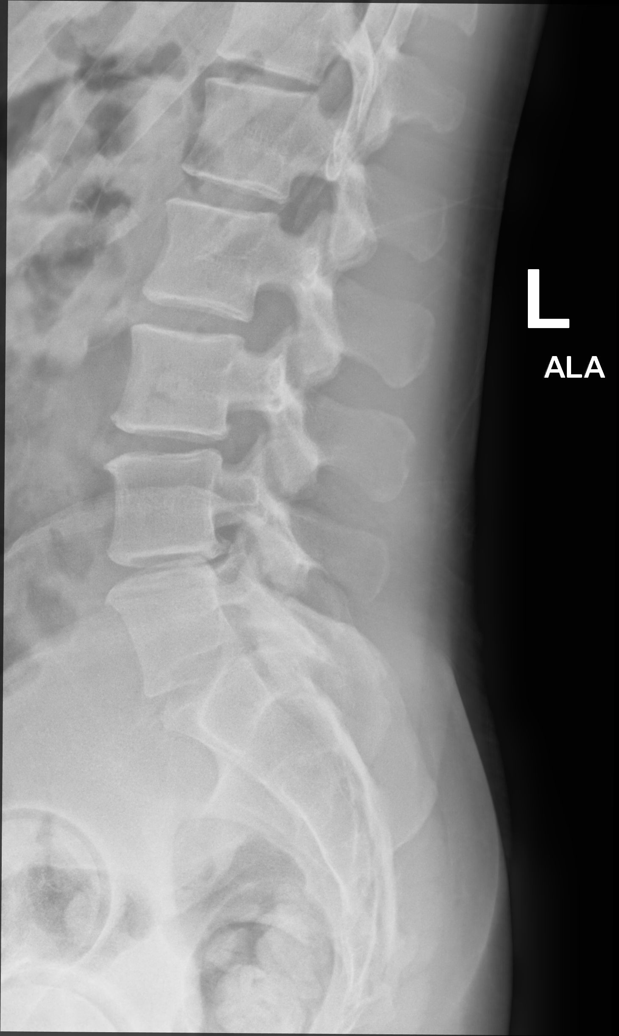

[lumbar spine lat (2 of 2)]
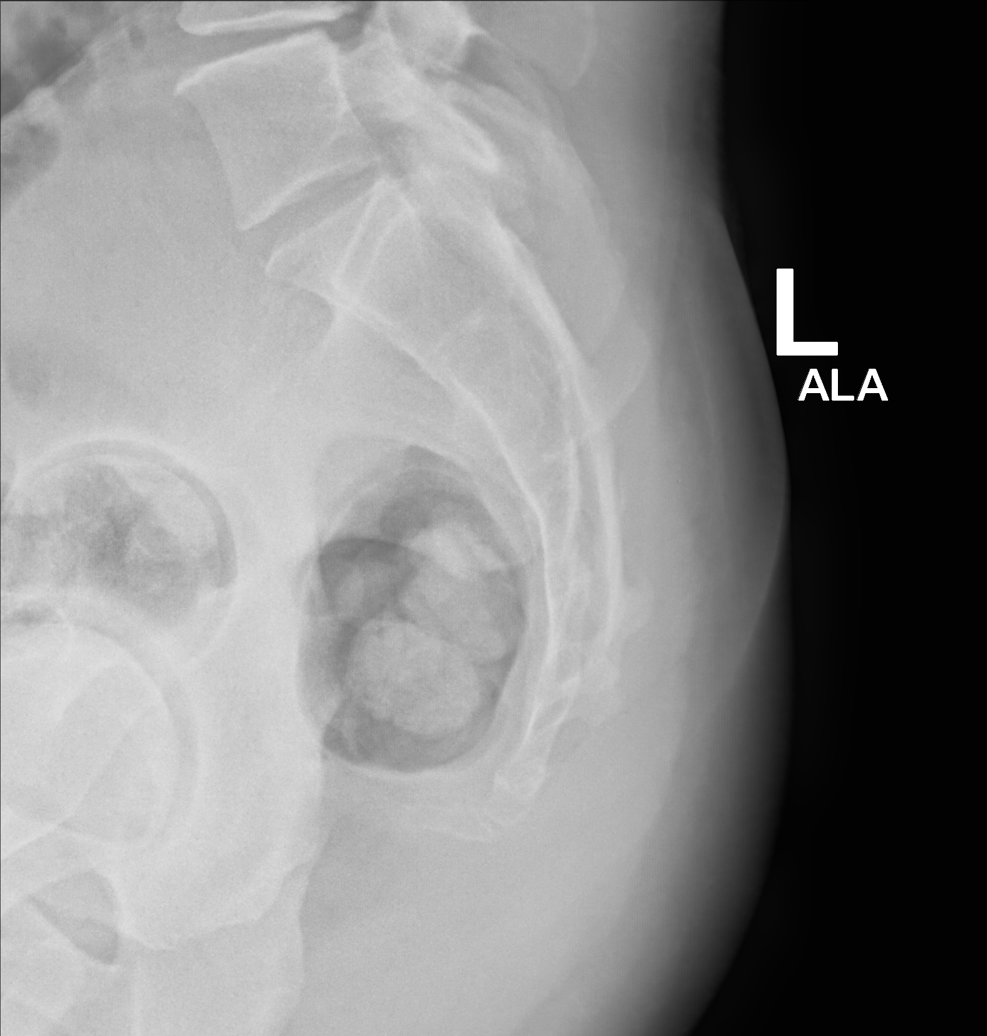

[5 of 5 positions shown; findings below may reference images not displayed]

FINDINGS: There is no evidence of lumbar spine fracture. Alignment is normal.
Mild endplate sclerosis is seen at the level of L4-L5. Mild to
moderate severity intervertebral disc space narrowing is also noted
at this level.
IMPRESSION: Mild degenerative changes at L4-L5.

## 2021-11-24 IMAGING — DX DG HIP (WITH OR WITHOUT PELVIS) 2-3V*R*
3 series · 3 of 3 positions shown · non-contrast
Comparison: None.

CLINICAL DATA: Right hip pain.

EXAM:
DG HIP (WITH OR WITHOUT PELVIS) 2-3V RIGHT

[pelvis ap]
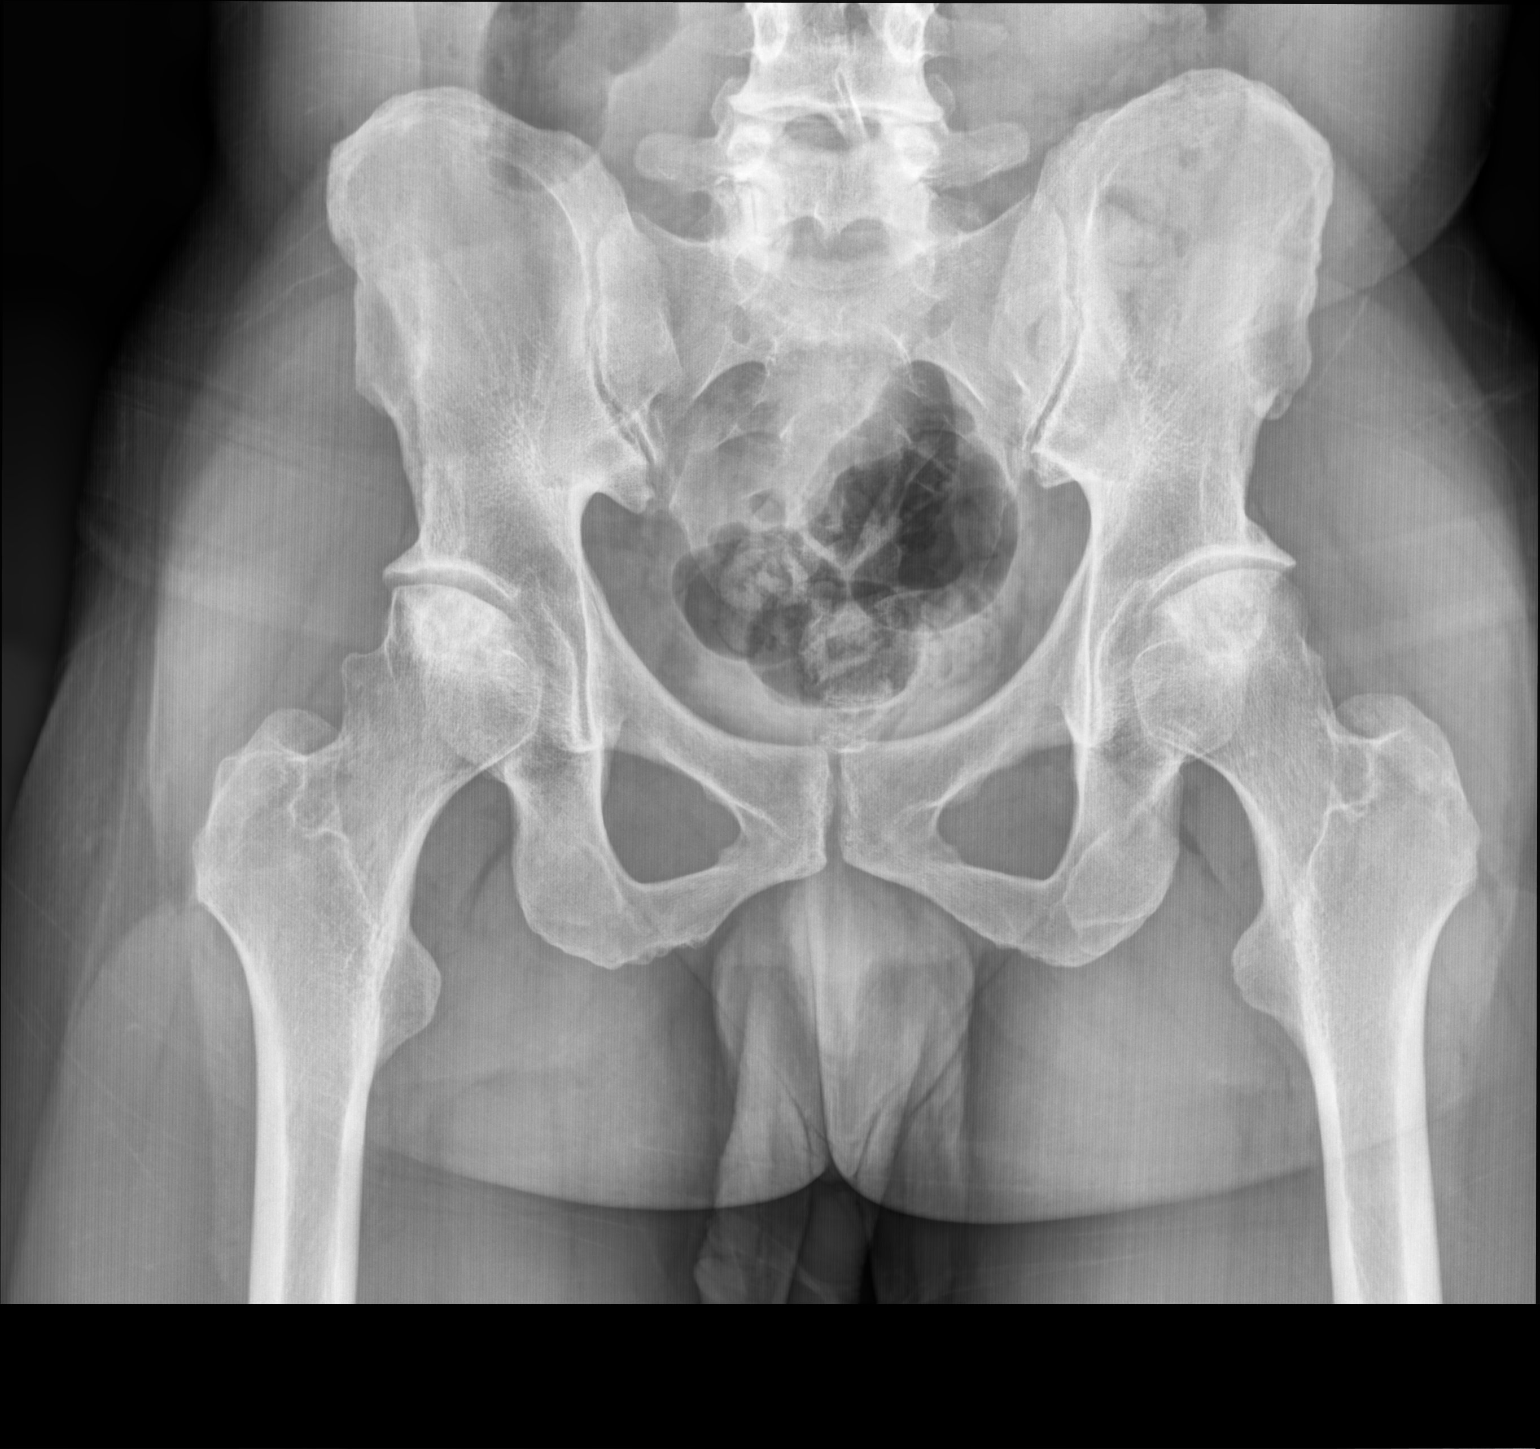

[hip joint ap]
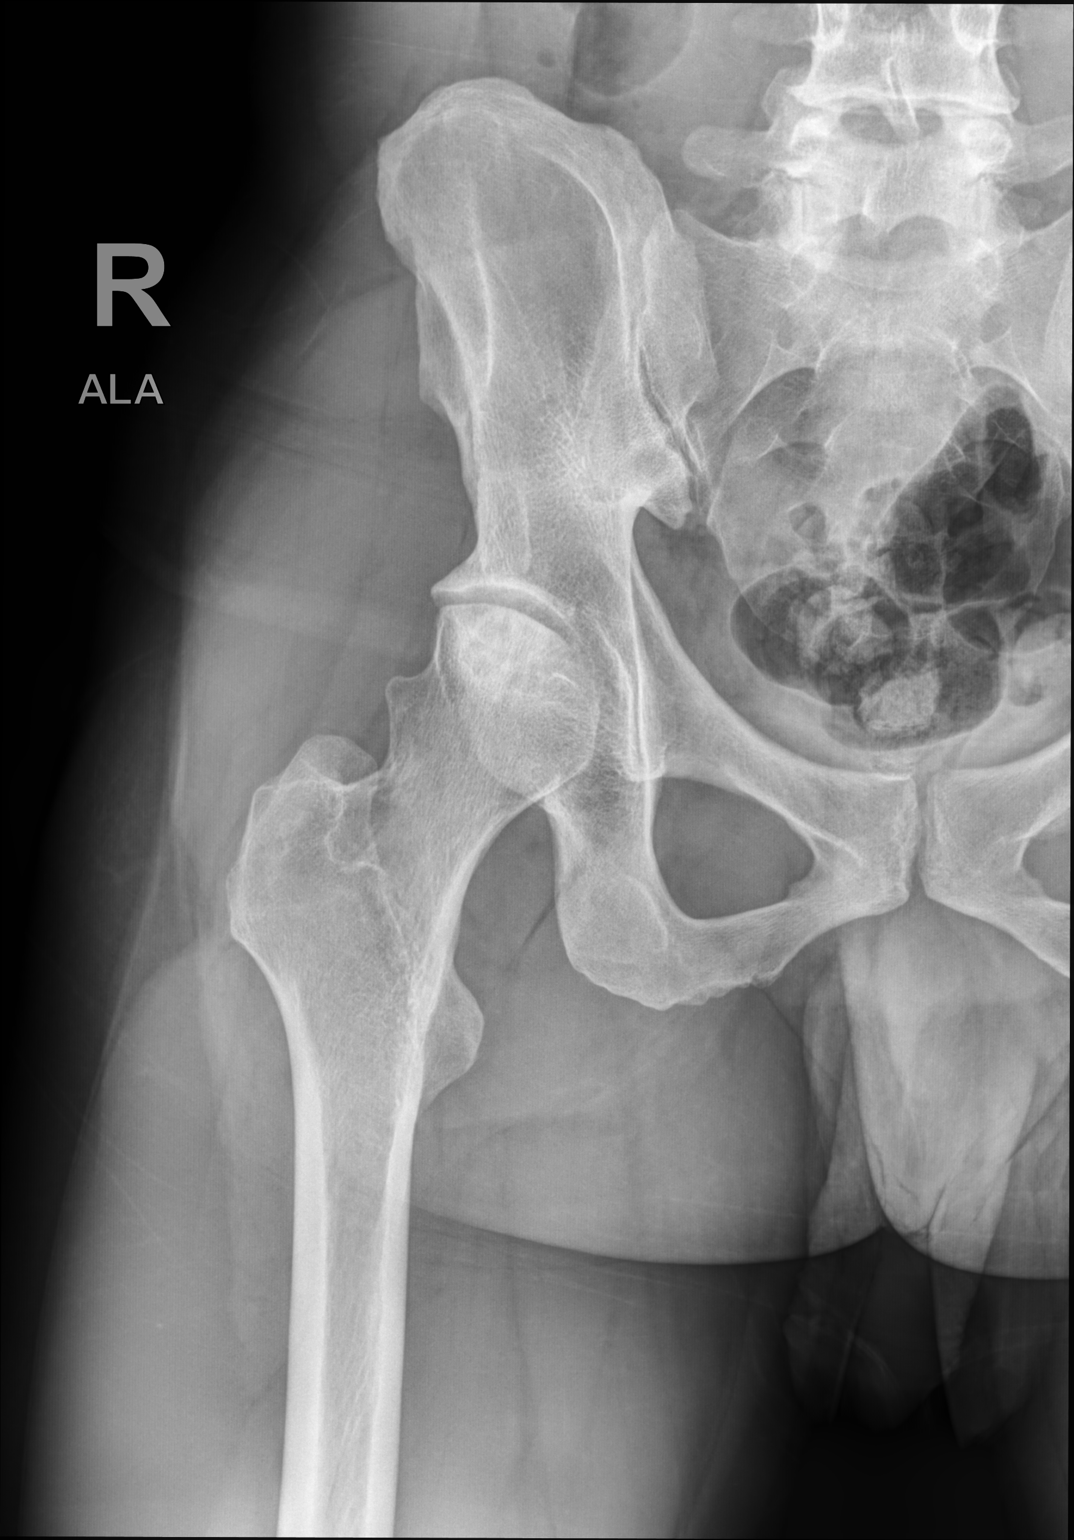

[hip (frog leg) lat]
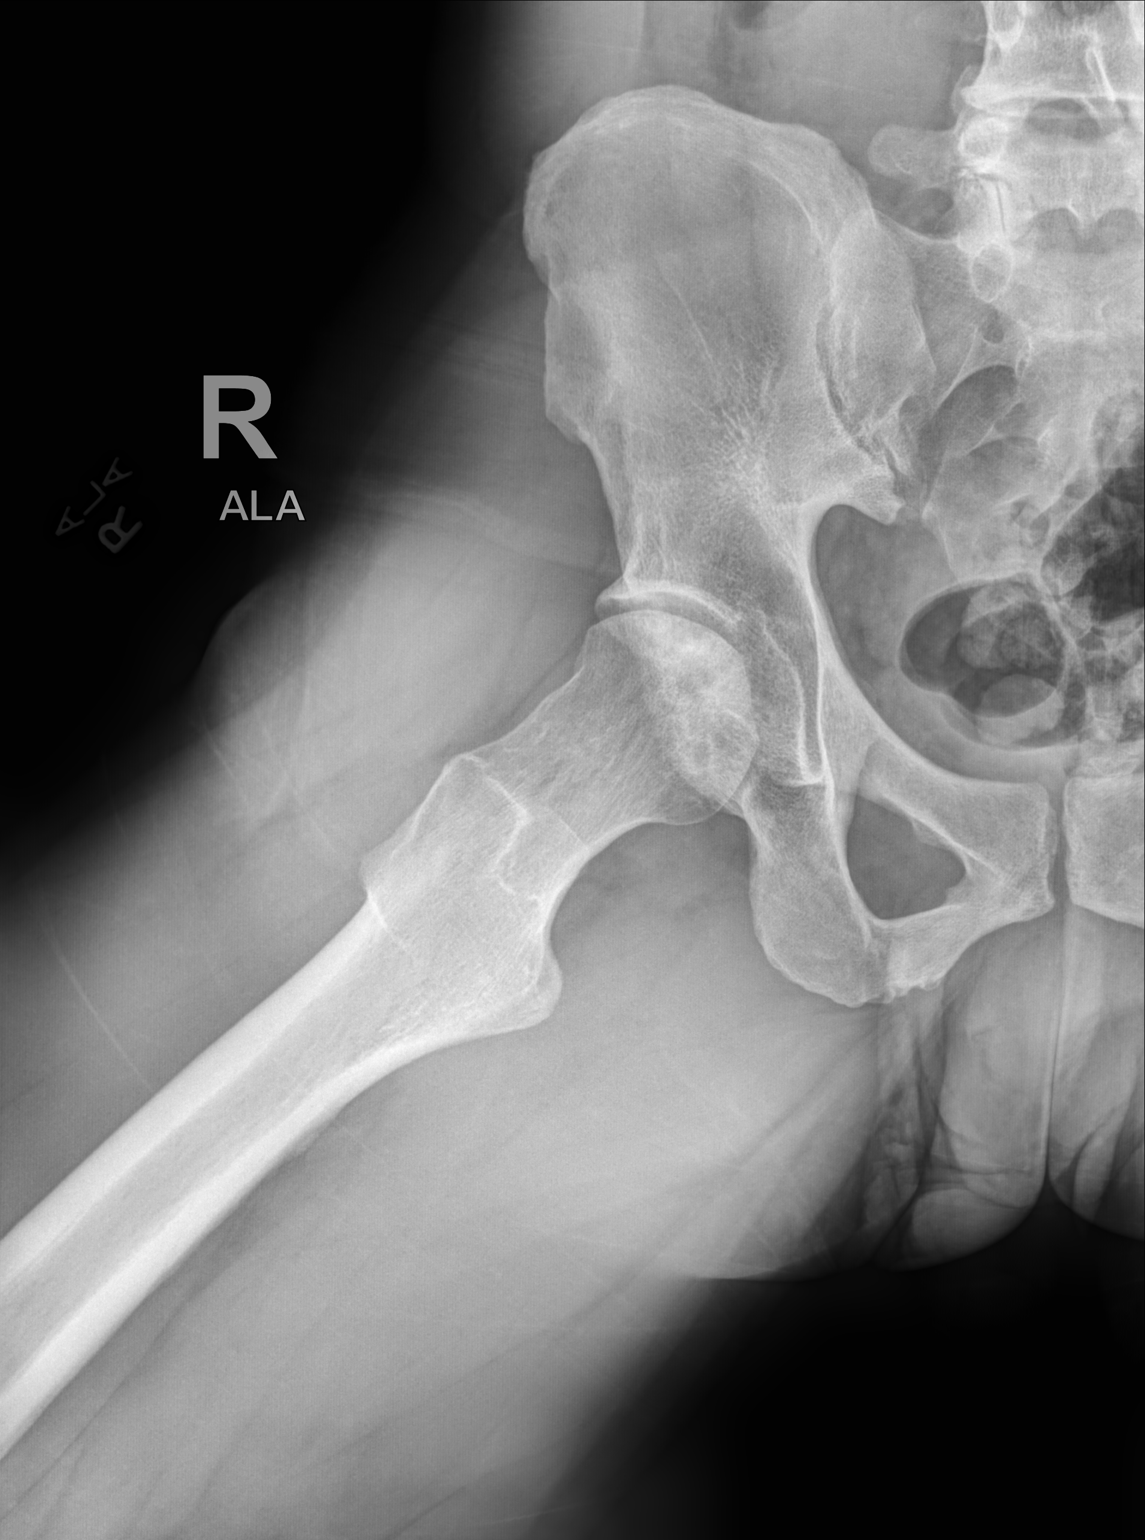

[3 of 3 positions shown; findings below may reference images not displayed]

FINDINGS: There is no evidence of an acute hip fracture or dislocation.
Chronic versus congenital deformities of the lateral aspects of the
bilateral femoral heads are seen. Chronic versus congenital
bilateral femoral head and bilateral acetabular sclerotic changes
are also noted.
IMPRESSION: Chronic versus congenital deformities of the bilateral femoral heads
with associated chronic changes involving both hips.

## 2021-11-24 MED ORDER — NAPROXEN 500 MG PO TABS: 500.0000 mg | ORAL_TABLET | Freq: Two times a day (BID) | ORAL | 0 refills | Status: DC

## 2021-11-24 NOTE — Progress Notes (Signed)
Subjective:    Patient ID: Alex Diaz, male    DOB: 1981/04/01, 41 y.o.   MRN: [id number]  HPI 41 year old male who  has a past medical history of GERD (gastroesophageal reflux disease) and Headache(784.0).  He presents to the office today for follow up regarding right sided sciatica.  He was seen roughly 5 weeks ago with this issue, at this time he reported that he was getting out of the shower and reached down to dry his feet and felt a sharp clinching pain in his low back and radiating pain down the outside of his right leg.  He was seen at urgent care and prescribed prednisone taper and Robaxin.  During this visit a month ago he reported that the prednisone helped for short period of time did not fully resolve the pain.  Was given a Depo-Medrol and Toradol injection in the office.  Today he reports that the pain has never fully gone away, he continues to have some mild low back tenderness/tightness with no radiating sharp pain down the outside of his right leg.  Some days are worse than others.  Walking seems to aggravate.   Review of Systems See HPI   Past Medical History:  Diagnosis Date   GERD (gastroesophageal reflux disease)    Headache(784.0)     Social History   Socioeconomic History   Marital status: Single    Spouse name: Not on file   Number of children: Not on file   Years of education: Not on file   Highest education level: Associate degree: occupational, Scientist, product/process development, or vocational program  Occupational History   Not on file  Tobacco Use   Smoking status: Former   Smokeless tobacco: Never  Substance and Sexual Activity   Alcohol use: Yes    Comment: occassionally   Drug use: No   Sexual activity: Yes  Other Topics Concern   Not on file  Social History Narrative   Not on file   Social Determinants of Health   Financial Resource Strain: Low Risk    Difficulty of Paying Living Expenses: Not hard at all  Food Insecurity: No Food Insecurity    Worried About Programme researcher, broadcasting/film/video in the Last Year: Never true   Ran Out of Food in the Last Year: Never true  Transportation Needs: No Transportation Needs   Lack of Transportation (Medical): No   Lack of Transportation (Non-Medical): No  Physical Activity: Sufficiently Active   Days of Exercise per Week: 7 days   Minutes of Exercise per Session: 60 min  Stress: No Stress Concern Present   Feeling of Stress : Only a little  Social Connections: Moderately Isolated   Frequency of Communication with Friends and Family: Twice a week   Frequency of Social Gatherings with Friends and Family: Twice a week   Attends Religious Services: More than 4 times per year   Active Member of [employer] or Organizations: No   Attends Engineer, structural: Not on file   Marital Status: Separated  Intimate Partner Violence: Not on file    No past surgical history on file.  Family History  Problem Relation Age of Onset   Hypertension Father    Kidney disease Father    Diabetes Paternal Aunt    Cancer Maternal Grandmother        lung   Diabetes Maternal Grandfather    Heart disease Paternal Grandmother    Stroke Paternal Grandmother     No Known  Allergies  Current Outpatient Medications on File Prior to Visit  Medication Sig Dispense Refill   atorvastatin (LIPITOR) 20 MG tablet Take 1 tablet (20 mg total) by mouth daily. 90 tablet 1   citalopram (CELEXA) 20 MG tablet Take 1 tablet (20 mg total) by mouth daily. 90 tablet 1   cyclobenzaprine (FLEXERIL) 10 MG tablet Take 1 tablet (10 mg total) by mouth 3 (three) times daily as needed for muscle spasms. 30 tablet 0   methocarbamol (ROBAXIN) 750 MG tablet Take 750 mg by mouth daily.     methylPREDNISolone (MEDROL DOSEPAK) 4 MG TBPK tablet TAKE ALL 6 TABLETS BY MOUTH ON DAY 1, THEN DECREASE BY ONE TABLET EACH DAY (6-5-4-3-2-1) 21 each 0   No current facility-administered medications on file prior to visit.    BP 100/80    Pulse 91    Temp 98.7  F (37.1 C) (Oral)    Ht 5\' 7"  (1.702 m)    Wt 181 lb (82.1 kg)    SpO2 99%    BMI 28.35 kg/m       Objective:   Physical Exam Vitals and nursing note reviewed.  Constitutional:      Appearance: Normal appearance.  Musculoskeletal:        General: Tenderness present. Normal range of motion.  Skin:    General: Skin is warm and dry.     Capillary Refill: Capillary refill takes less than 2 seconds.  Neurological:     General: No focal deficit present.     Mental Status: He is alert and oriented to person, place, and time.  Psychiatric:        Mood and Affect: Mood normal.        Behavior: Behavior normal.        Thought Content: Thought content normal.        Judgment: Judgment normal.      Assessment & Plan:  1. Acute right-sided low back pain with right-sided sciatica -We will get x-ray of the lumbar spine today.  Medical therapy.  Consider advanced imaging in the future and/or referral to orthopedics - DG Lumbar Spine Complete; Future - DG Hip Unilat W OR W/O Pelvis 2-3 Views Right; Future - Ambulatory referral to Physical Therapy - naproxen (NAPROSYN) 500 MG tablet; Take 1 tablet (500 mg total) by mouth 2 (two) times daily with a meal.  Dispense: 180 tablet; Refill: 0  , NP

## 2021-11-28 ENCOUNTER — Other Ambulatory Visit: Payer: Self-pay | Admitting: Adult Health

## 2021-11-28 DIAGNOSIS — M21951 Unspecified acquired deformity of right thigh: Secondary | ICD-10-CM

## 2021-11-29 ENCOUNTER — Telehealth: Payer: Self-pay | Admitting: Adult Health

## 2021-11-29 NOTE — Telephone Encounter (Signed)
Patient notified of update  and verbalized understanding. 

## 2021-11-29 NOTE — Telephone Encounter (Signed)
Pt is returning call to office about xray results.

## 2021-11-30 ENCOUNTER — Encounter: Payer: Self-pay | Admitting: Rehabilitative and Restorative Service Providers"

## 2021-11-30 ENCOUNTER — Ambulatory Visit (INDEPENDENT_AMBULATORY_CARE_PROVIDER_SITE_OTHER): Admitting: Rehabilitative and Restorative Service Providers"

## 2021-11-30 ENCOUNTER — Other Ambulatory Visit: Payer: Self-pay

## 2021-11-30 DIAGNOSIS — M5441 Lumbago with sciatica, right side: Secondary | ICD-10-CM | POA: Diagnosis not present

## 2021-11-30 DIAGNOSIS — R262 Difficulty in walking, not elsewhere classified: Secondary | ICD-10-CM

## 2021-11-30 DIAGNOSIS — M79604 Pain in right leg: Secondary | ICD-10-CM

## 2021-11-30 NOTE — Therapy (Signed)
OUTPATIENT PHYSICAL THERAPY THORACOLUMBAR EVALUATION   Patient Name: Alex Diaz MRN: [id number] DOB:[birth year], 41 y.o., male Today's Date: 11/30/2021   PT End of Session - 11/30/21 1431     Visit Number 1    Number of Visits 20    Date for PT Re-Evaluation 02/08/22    Authorization Type [employer]    PT Start Time 1434    PT Stop Time 1506    PT Time Calculation (min) 32 min    Activity Tolerance Patient tolerated treatment well    Behavior During Therapy WFL for tasks assessed/performed             Past Medical History:  Diagnosis Date   GERD (gastroesophageal reflux disease)    Headache(784.0)    History reviewed. No pertinent surgical history. Patient Active Problem List   Diagnosis Date Noted   GERD (gastroesophageal reflux disease) 12/13/2012    PCP: Dorothyann Peng, NP  REFERRING PROVIDER: Dorothyann Peng, NP  REFERRING DIAG: M54.41 (ICD-10-CM) - Acute right-sided low back pain with right-sided sciatica  THERAPY DIAG:  Acute right-sided low back pain with right-sided sciatica  Pain in right leg  Difficulty in walking, not elsewhere classified  ONSET DATE: 10/08/2021  SUBJECTIVE:                                                                                                                                                                                           SUBJECTIVE STATEMENT: Pt indicated having pain in lower back, mostly on Rt side.  Progressively got worse and went into Rt hip and shooting pain down the Rt leg.  Pt indicated he has started some stretching that has helped some.  Pt has medicine that helps as well. Pt indicated trouble getting to sleep because of symptoms   PERTINENT HISTORY:  Denied history of similar pain  PAIN:  Are you having pain? Yes NPRS scale: current: 4/10, at worst in last few weeks 10/10 Pain location: back, Rt leg Pain orientation: back, Rt leg PAIN TYPE: acute Pain description: intermittent, throbbing,  shooting .  Recent onset of numbness/tingling Aggravating factors: bending, prolonged standing Relieving factors: sitting, heat  PRECAUTIONS: None  WEIGHT BEARING RESTRICTIONS No  FALLS:  Has patient fallen in last 6 months? 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   PLOF: Independent, running for exercise   PATIENT GOALS Reduce pain, run again   OBJECTIVE:   DIAGNOSTIC FINDINGS:  xray lumbar: Mild degenerative changes at L4-L5.  PATIENT SURVEYS:  11/30/2021: FOTO intake 61, predicted 74  SCREENING FOR RED FLAGS:  11/30/2021: no red flags noted (denied bowel/bladder  changes, saddle anesthesia.   COGNITION:  11/30/2021: Overall cognitive status: Within functional limits for tasks assessed     SENSATION:  11/30/2021: Light touch: Appears intact    MUSCLE LENGTH: 11/30/2021: Passive SLR Rt 70 deg, Lt 40 c pain in Rt hip/thigh  POSTURE:  11/30/2021: Equal iliac crest height today, no observed lateral shift.   PALPATION: 11/30/2021: No specific tenderness in lumbar paraspinals.    LUMBAR AROM/PROM  A/PROM A/PROM  11/30/2021  Flexion Movement to mid shin, peripheralization into Rt leg noted, painful arc upon return.   Extension 50% c ERP Rt lumbar/posterior hip.  Repeated extension x 5 in standing continued end range pain and produced worsening to Rt knee  Right lateral flexion   Left lateral flexion   Right rotation   Left rotation    (Blank rows = not tested)  11/30/2021: prone press up extension produced Rt lumbar/posterior hip end range pain but no leg symptoms noted  LE AROM/PROM:  A/PROM Right 11/30/2021 Left 11/30/2021  Hip flexion    Hip extension    Hip abduction    Hip adduction    Hip internal rotation    Hip external rotation    Knee flexion    Knee extension    Ankle dorsiflexion    Ankle plantarflexion    Ankle inversion    Ankle eversion     (Blank rows = not tested)  LE MMT:  MMT Right 11/30/2021  Left 11/30/2021  Hip flexion 5/5 5/5  Hip extension    Hip abduction    Hip adduction    Hip internal rotation    Hip external rotation    Knee flexion    Knee extension    Ankle dorsiflexion    Ankle plantarflexion    Ankle inversion    Ankle eversion     (Blank rows = not tested)  LUMBAR SPECIAL TESTS:  11/30/2021 : (-) Slump on Rt, (-) crossed SLR noted  FUNCTIONAL TESTS:  11/30/2021: Non tested at this time.  GAIT: 11/30/2021:  independent ambulation c no observed deviation.     TODAY'S TREATMENT  11/30/2021:  Therex:  HEP instruction/performance c cues for techniques, handout provided.  Trial set performed of each for comprehension and symptom assessment.  See below for exercise list.   PATIENT EDUCATION:  Education details: HEP, POC  Person educated: Patient Education method: Explanation, Demonstration, Verbal cues, and Handouts Education comprehension: verbalized understanding and returned demonstration   HOME EXERCISE PROGRAM: 11/30/2021:  Access Code: [email or web address] URL: [email or web address] Date: 11/30/2021 Prepared by: Scot Jun  Exercises Prone Press Up - 2-3 x daily - 7 x weekly - 1-2 sets - 5-10 reps - 1-2 hold Supine 90/90 Sciatic Nerve Glide with Knee Flexion/Extension - 2-3 x daily - 7 x weekly - 3 sets - 10 reps Supine Lower Trunk Rotation - 2-3 x daily - 7 x weekly - 1 sets - 5 reps - 15 hold Supine Piriformis Stretch Pulling Heel to Hip - 2-3 x daily - 7 x weekly - 1 sets - 5 reps - 15-30 hold   ASSESSMENT:  CLINICAL IMPRESSION: Patient is a 41  y.o. who comes to clinic with complaints of low back pain, Rt leg pain with c possible directional specific preference for extension c mobility deficits noted that impair their ability to perform usual daily and recreational functional activities without increase difficulty/symptoms at this time.  Patient to benefit from skilled PT services to address impairments and limitations to improve to  previous level  of function without restriction secondary to condition.    OBJECTIVE IMPAIRMENTS decreased activity tolerance, decreased endurance, decreased mobility, difficulty walking, decreased ROM, hypomobility, impaired perceived functional ability, impaired flexibility, improper body mechanics, and pain.   ACTIVITY LIMITATIONS cleaning, community activity, occupation, and exercise routine .   REHAB POTENTIAL: Good  CLINICAL DECISION MAKING: Stable/uncomplicated  EVALUATION COMPLEXITY: Low   GOALS: Goals reviewed with patient? Yes Eval: 11/30/2021 SHORT TERM GOALS:  STG Name Target Date Goal status  1 Patient will demonstrate independent use of home exercise program to maintain progress from in clinic treatments.  12/21/2021 INITIAL                                 LONG TERM GOALS:   LTG Name Target Date Goal status  1 Patient will demonstrate/report pain at worst less than or equal to 2/10 to facilitate minimal limitation in daily activity secondary to pain symptoms.  02/08/2022 INITIAL  2 Patient will demonstrate independent use of home exercise program to facilitate ability to maintain/progress functional gains from skilled physical therapy services.  02/08/2022 INITIAL  3 Patient will demonstrate lumbar extension 100 % WFL s symptoms to facilitate upright standing, walking posture at PLOF s limitation. 02/08/2022 INITIAL  4 Patient will demonstrate FOTO outcome > or = 74 % to indicated reduced disability due to condition. 02/08/2022 INITIAL  5 Patient will demonstrate Rt slr equal to Lt to facilitate mobility at PLOF.  02/08/2022 INITIAL  6 Patient will demonstrate/report ability to return to running for exercise.  02/08/2022 INITIAL        PLAN: PT FREQUENCY: 1-2x/week  PT DURATION: 10 weeks  PLANNED INTERVENTIONS: Therapeutic exercises, Therapeutic activity, Neuro Muscular re-education, Balance training, Gait training, Patient/Family education, Joint mobilization, Stair  training, DME instructions, Dry Needling, Electrical stimulation, Cryotherapy, Moist heat, Taping, Ultrasound, Ionotophoresis 4mg /ml Dexamethasone, and Manual therapy.  All included unless contraindicated  PLAN FOR NEXT SESSION: Review existing HEP.  Recheck centralization c extension movements.    Scot Jun, PT, DPT, OCS, ATC 11/30/21  3:13 PM

## 2021-12-07 ENCOUNTER — Other Ambulatory Visit (HOSPITAL_COMMUNITY): Payer: Self-pay

## 2021-12-07 ENCOUNTER — Encounter: Payer: Self-pay | Admitting: Adult Health

## 2021-12-07 DIAGNOSIS — F419 Anxiety disorder, unspecified: Secondary | ICD-10-CM

## 2021-12-07 DIAGNOSIS — E782 Mixed hyperlipidemia: Secondary | ICD-10-CM

## 2021-12-07 MED ORDER — CITALOPRAM HYDROBROMIDE 20 MG PO TABS: 20.0000 mg | ORAL_TABLET | Freq: Every day | ORAL | 1 refills | Status: DC

## 2021-12-07 MED ORDER — ATORVASTATIN CALCIUM 20 MG PO TABS: 20.0000 mg | ORAL_TABLET | Freq: Every day | ORAL | 1 refills | Status: DC

## 2021-12-08 ENCOUNTER — Other Ambulatory Visit: Payer: Self-pay

## 2021-12-08 DIAGNOSIS — M5441 Lumbago with sciatica, right side: Secondary | ICD-10-CM

## 2021-12-08 MED ORDER — NAPROXEN 500 MG PO TABS: 500.0000 mg | ORAL_TABLET | Freq: Two times a day (BID) | ORAL | 0 refills | Status: AC

## 2021-12-13 ENCOUNTER — Encounter: Admitting: Rehabilitative and Restorative Service Providers"

## 2021-12-15 ENCOUNTER — Encounter: Admitting: Rehabilitative and Restorative Service Providers"

## 2021-12-15 ENCOUNTER — Telehealth: Payer: Self-pay | Admitting: Rehabilitative and Restorative Service Providers"

## 2021-12-15 NOTE — Telephone Encounter (Signed)
No show for appointment.  Left message and reminder of next appointment time.  ? ?Chyrel Masson, PT, DPT, OCS, ATC ?12/15/21  1:58 PM ? ? ?

## 2021-12-15 NOTE — Therapy (Incomplete)
?OUTPATIENT PHYSICAL THERAPY TREATMENT NOTE ? ? ?Patient Name: Alex Diaz ?MRN: [id number] ?DOB:[birth year], 41 y.o., male ?Today's Date: 12/15/2021 ? ?PCP: Shirline FreesNafziger, Cory, NP ?REFERRING PROVIDER: Shirline FreesNafziger, Cory, NP ? ? ? ?Past Medical History:  ?Diagnosis Date  ? GERD (gastroesophageal reflux disease)   ? Headache(784.0)   ? ?No past surgical history on file. ?Patient Active Problem List  ? Diagnosis Date Noted  ? GERD (gastroesophageal reflux disease) 12/13/2012  ? ? ?REFERRING PROVIDER: Shirline FreesNafziger, Cory, NP ?  ?REFERRING DIAG: M54.41 (ICD-10-CM) - Acute right-sided low back pain with right-sided sciatica ? ?ONSET DATE: 10/08/2021 ? ?THERAPY DIAG:  ?No diagnosis found. ? ?PERTINENT HISTORY: Denied history of similar pain ? ?PRECAUTIONS: None ? ?SUBJECTIVE: *** ? ?PAIN:  ?Are you having pain? Yes ?NPRS scale: current: 4/10, at worst in last few weeks 10/10 ?Pain location: back, Rt leg ?Pain orientation: back, Rt leg ?PAIN TYPE: acute ?Pain description: intermittent, throbbing, shooting .  Recent onset of numbness/tingling ?Aggravating factors: bending, prolonged standing ?Relieving factors: sitting, heat ? ? ? ? ?OBJECTIVE:  ?  ?DIAGNOSTIC FINDINGS:  ?xray lumbar: Mild degenerative changes at L4-L5. ?  ?PATIENT SURVEYS:  ?11/30/2021: FOTO intake 61, predicted 74 ?  ?SCREENING FOR RED FLAGS: ?          11/30/2021: no red flags noted (denied bowel/bladder changes, saddle anesthesia.  ?  ?COGNITION: ?         11/30/2021: Overall cognitive status: Within functional limits for tasks assessed                  ?          ?SENSATION: ?         11/30/2021: Light touch: Appears intact ?          ?  ?MUSCLE LENGTH: ?11/30/2021: ?Passive SLR Rt 70 deg, Lt 40 c pain in Rt hip/thigh ?  ?POSTURE:  ?11/30/2021: Equal iliac crest height today, no observed lateral shift.  ?  ?PALPATION: ?11/30/2021: No specific tenderness in lumbar paraspinals.   ?  ?LUMBAR AROM/PROM ?  ?A/PROM A/PROM  ?11/30/2021  ?Flexion Movement to mid shin,  peripheralization into Rt leg noted, painful arc upon return.   ?Extension 50% c ERP Rt lumbar/posterior hip.  Repeated extension x 5 in standing continued end range pain and produced worsening to Rt knee  ?Right lateral flexion    ?Left lateral flexion    ?Right rotation    ?Left rotation    ? (Blank rows = not tested) ?  ?11/30/2021: prone press up extension produced Rt lumbar/posterior hip end range pain but no leg symptoms noted ?  ?LE AROM/PROM: ?  ?A/PROM Right ?11/30/2021 Left ?11/30/2021  ?Hip flexion      ?Hip extension      ?Hip abduction      ?Hip adduction      ?Hip internal rotation      ?Hip external rotation      ?Knee flexion      ?Knee extension      ?Ankle dorsiflexion      ?Ankle plantarflexion      ?Ankle inversion      ?Ankle eversion      ? (Blank rows = not tested) ?  ?LE MMT: ?  ?MMT Right ?11/30/2021 Left ?11/30/2021  ?Hip flexion 5/5 5/5  ?Hip extension      ?Hip abduction      ?Hip adduction      ?Hip internal rotation      ?Hip  external rotation      ?Knee flexion      ?Knee extension      ?Ankle dorsiflexion      ?Ankle plantarflexion      ?Ankle inversion      ?Ankle eversion      ? (Blank rows = not tested) ?  ?LUMBAR SPECIAL TESTS:  ?11/30/2021 : (-) Slump on Rt, (-) crossed SLR noted ?  ?FUNCTIONAL TESTS:  ?11/30/2021: Non tested at this time. ?  ?GAIT: ?11/30/2021:  independent ambulation c no observed deviation.  ?  ?  ?  ?TODAY'S TREATMENT  ?12/15/2021:  ?Therex:      HEP instruction/performance c cues for techniques, handout provided.  Trial set performed of each for comprehension and symptom assessment.  See below for exercise list. ? ?11/30/2021:  ?Therex:      HEP instruction/performance c cues for techniques, handout provided.  Trial set performed of each for comprehension and symptom assessment.  See below for exercise list. ?  ?  ?PATIENT EDUCATION:  ?Education details: HEP, POC  ?Person educated: Patient ?Education method: Explanation, Demonstration, Verbal cues, and  Handouts ?Education comprehension: verbalized understanding and returned demonstration ?  ?  ?HOME EXERCISE PROGRAM: ?11/30/2021:  ?Access Code: [email or web address] ?URL: [email or web address] ?Date: 11/30/2021 ?Prepared by: Chyrel Masson ?  ?Exercises ?Prone Press Up - 2-3 x daily - 7 x weekly - 1-2 sets - 5-10 reps - 1-2 hold ?Supine 90/90 Sciatic Nerve Glide with Knee Flexion/Extension - 2-3 x daily - 7 x weekly - 3 sets - 10 reps ?Supine Lower Trunk Rotation - 2-3 x daily - 7 x weekly - 1 sets - 5 reps - 15 hold ?Supine Piriformis Stretch Pulling Heel to Hip - 2-3 x daily - 7 x weekly - 1 sets - 5 reps - 15-30 hold ?  ?  ?ASSESSMENT: ?  ?CLINICAL IMPRESSION: ?Patient is a 41  y.o. who comes to clinic with complaints of low back pain, Rt leg pain with c possible directional specific preference for extension c mobility deficits noted that impair their ability to perform usual daily and recreational functional activities without increase difficulty/symptoms at this time.  Patient to benefit from skilled PT services to address impairments and limitations to improve to previous level of function without restriction secondary to condition.  ?  ?  ?OBJECTIVE IMPAIRMENTS decreased activity tolerance, decreased endurance, decreased mobility, difficulty walking, decreased ROM, hypomobility, impaired perceived functional ability, impaired flexibility, improper body mechanics, and pain.  ?  ?ACTIVITY LIMITATIONS cleaning, community activity, occupation, and exercise routine .  ?  ?REHAB POTENTIAL: Good ?  ?CLINICAL DECISION MAKING: Stable/uncomplicated ?  ?EVALUATION COMPLEXITY: Low ?  ?  ?GOALS: ?Goals reviewed with patient? Yes ?Eval: 11/30/2021 ?SHORT TERM GOALS: ?  ?STG Name Target Date Goal status  ?1 Patient will demonstrate independent use of home exercise program to maintain progress from in clinic treatments.  12/21/2021 On going ?Assessed 12/15/2021  ?         ?         ?         ?         ?         ?         ?   ?LONG TERM GOALS:  ?  ?LTG Name Target Date Goal status  ?1 Patient will demonstrate/report pain at worst less than or equal to 2/10 to facilitate minimal limitation in daily activity secondary to pain symptoms.  02/08/2022 INITIAL  ?  2 Patient will demonstrate independent use of home exercise program to facilitate ability to maintain/progress functional gains from skilled physical therapy services.  02/08/2022 INITIAL  ?3 Patient will demonstrate lumbar extension 100 % WFL s symptoms to facilitate upright standing, walking posture at PLOF s limitation. 02/08/2022 INITIAL  ?4 Patient will demonstrate FOTO outcome > or = 74 % to indicated reduced disability due to condition. 02/08/2022 INITIAL  ?5 Patient will demonstrate Rt slr equal to Lt to facilitate mobility at PLOF.  02/08/2022 INITIAL  ?6 Patient will demonstrate/report ability to return to running for exercise.  02/08/2022 INITIAL  ?         ?  ?PLAN: ?PT FREQUENCY: 1-2x/week ?  ?PT DURATION: 10 weeks ?  ?PLANNED INTERVENTIONS: Therapeutic exercises, Therapeutic activity, Neuro Muscular re-education, Balance training, Gait training, Patient/Family education, Joint mobilization, Stair training, DME instructions, Dry Needling, Electrical stimulation, Cryotherapy, Moist heat, Taping, Ultrasound, Ionotophoresis 4mg /ml Dexamethasone, and Manual therapy.  All included unless contraindicated ?  ?PLAN FOR NEXT SESSION: Review existing HEP.  Recheck centralization c extension movements.  ? ? ? , PT, DPT, OCS, ATC ?12/15/21  12:52 PM ? ? ? ?  ? ?

## 2021-12-16 ENCOUNTER — Other Ambulatory Visit: Payer: Self-pay

## 2021-12-16 ENCOUNTER — Ambulatory Visit
Admission: RE | Admit: 2021-12-16 | Discharge: 2021-12-16 | Disposition: A | Discharge status: Home / Self Care | Source: Ambulatory Visit | Attending: Adult Health | Admitting: Adult Health

## 2021-12-16 DIAGNOSIS — M21952 Unspecified acquired deformity of left thigh: Secondary | ICD-10-CM

## 2021-12-16 DIAGNOSIS — M21951 Unspecified acquired deformity of right thigh: Secondary | ICD-10-CM

## 2021-12-16 IMAGING — MR MR HIP*R* W/O CM
5 series · 37 of 40 positions shown · non-contrast
Comparison: None.

CLINICAL DATA: Bilateral hip pain since [DATE]. No known
injury.

EXAM:
MR OF THE RIGHT HIP WITHOUT CONTRAST
TECHNIQUE: Multiplanar, multisequence MR imaging was performed. No intravenous
contrast was administered.

[Series 3: T1 · coronal · 4.0mm · 1.19mm/px · 8 of 24 slices shown]
[im 1/24]
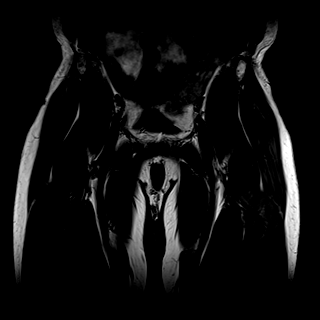
[im 4/24]
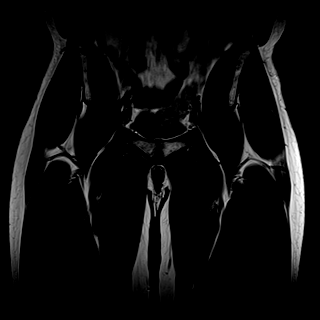
[im 7/24]
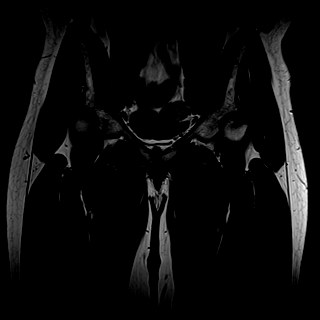
[im 10/24]
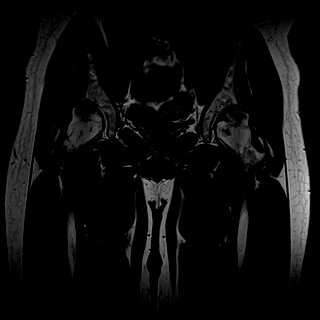
[im 14/24]
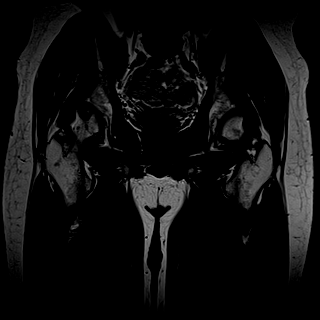
[im 17/24]
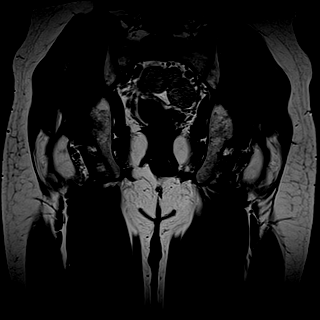
[im 20/24]
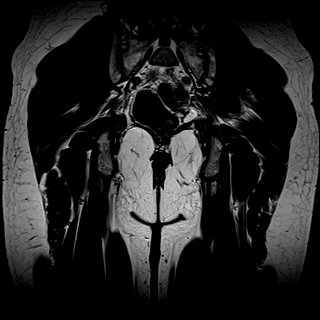
[im 24/24]
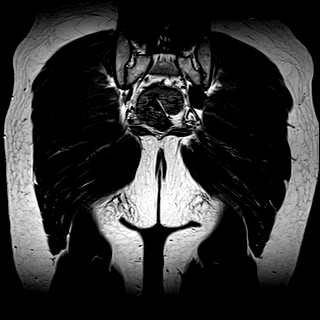

[Series 4: T2 fat-sat · coronal · 4.0mm · 1.19mm/px · 8 of 24 slices shown (1 of 2)]
[im 1/24]
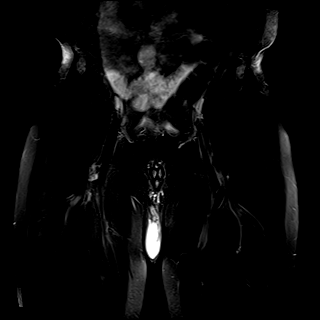
[im 4/24]
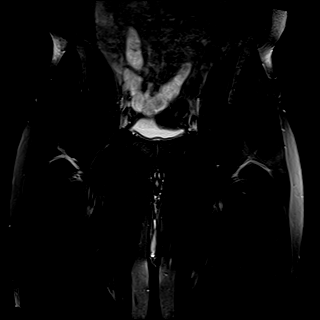
[im 7/24]
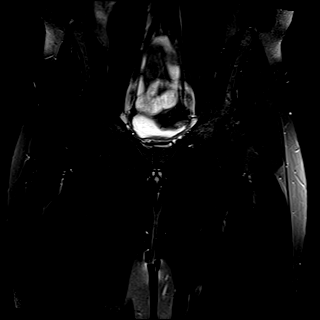
[im 10/24]
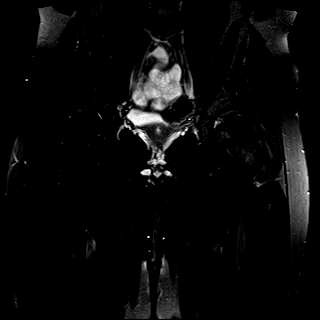
[im 14/24]
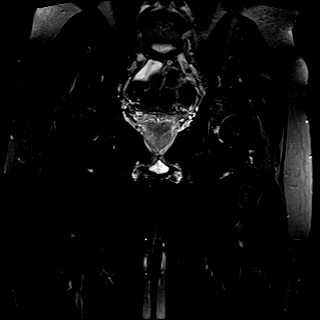
[im 17/24]
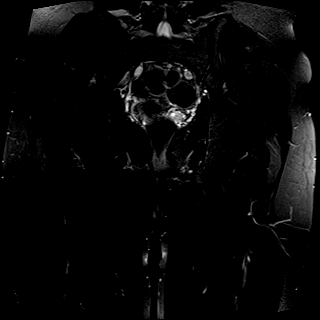
[im 20/24]
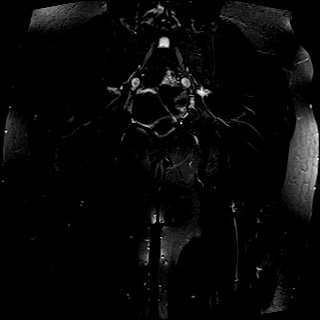
[im 24/24]
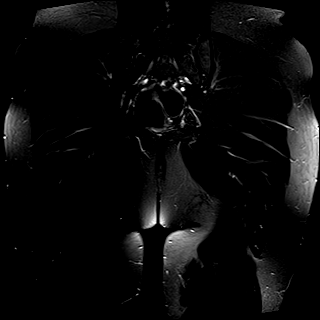

[Series 5: T2 fat-sat · axial · 4.0mm · 0.62mm/px · z∈[-41,+94]mm · 8 of 29 slices shown (2 of 2)]
[im 1/29]
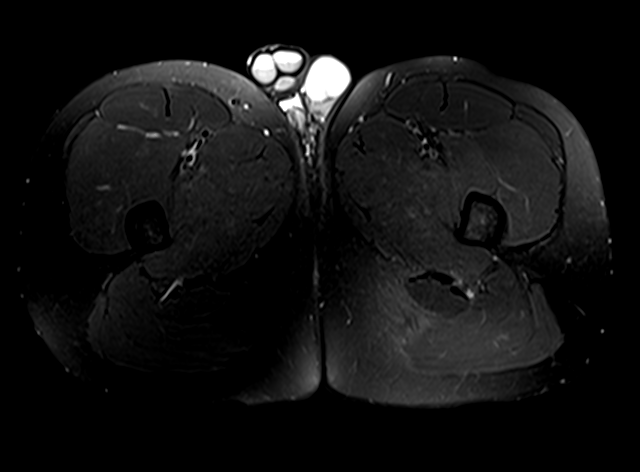
[im 4/29]
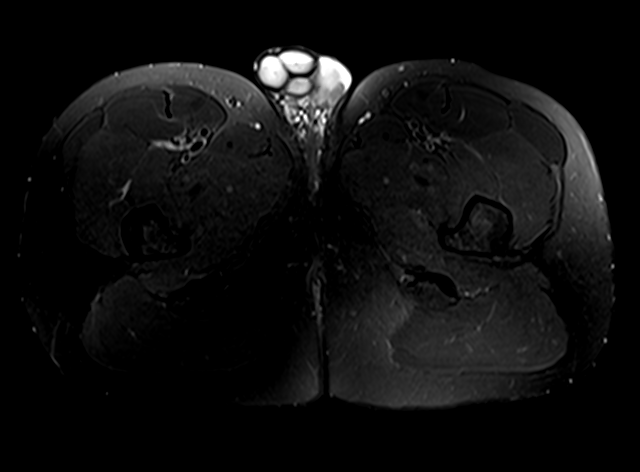
[im 10/29]
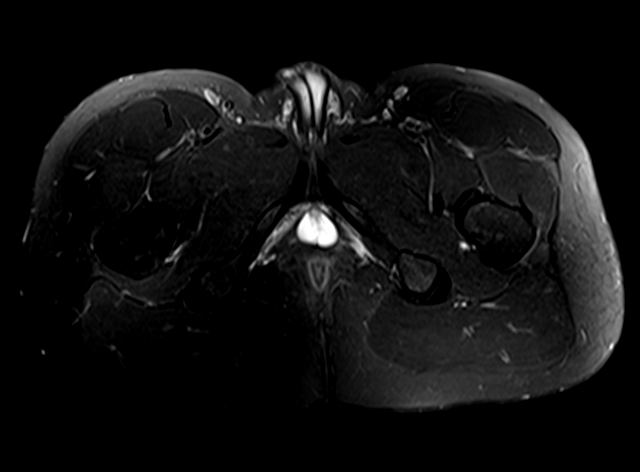
[im 13/29]
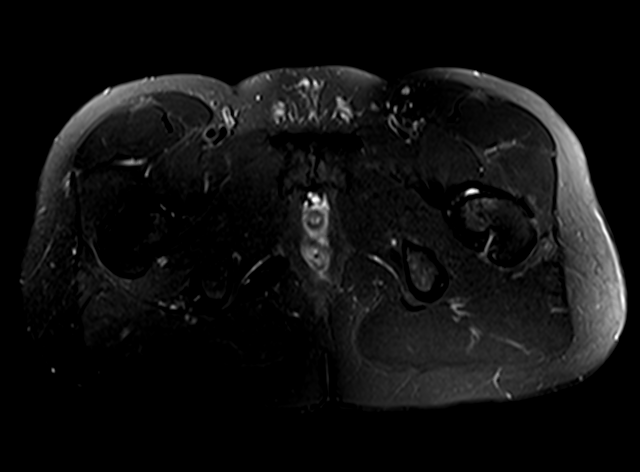
[im 16/29]
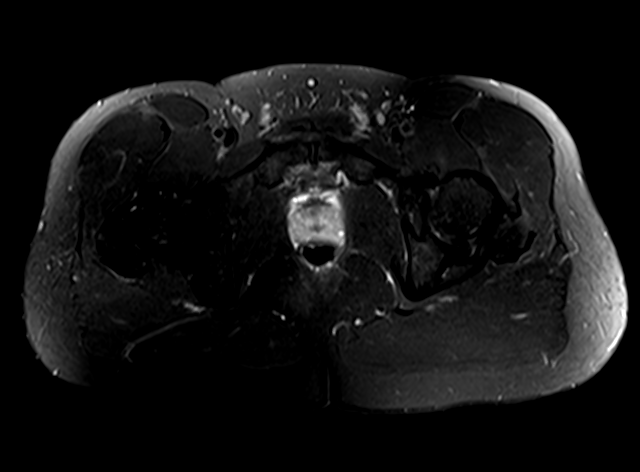
[im 19/29]
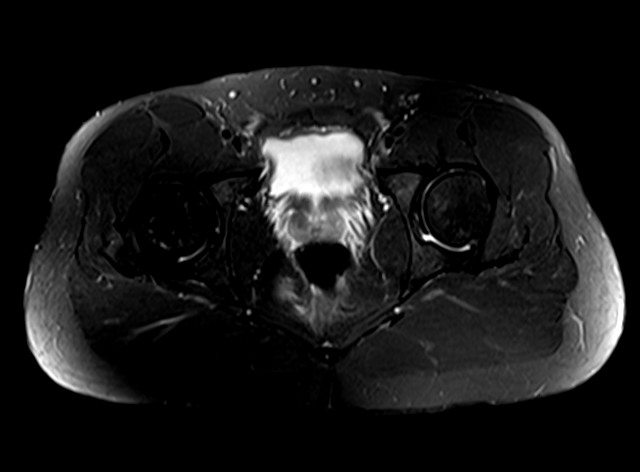
[im 25/29]
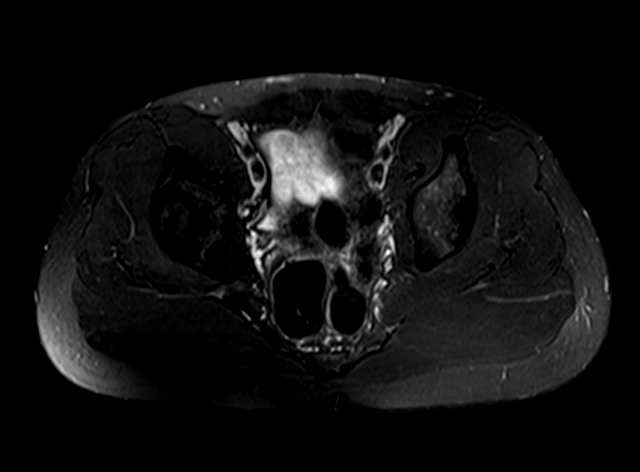
[im 29/29]
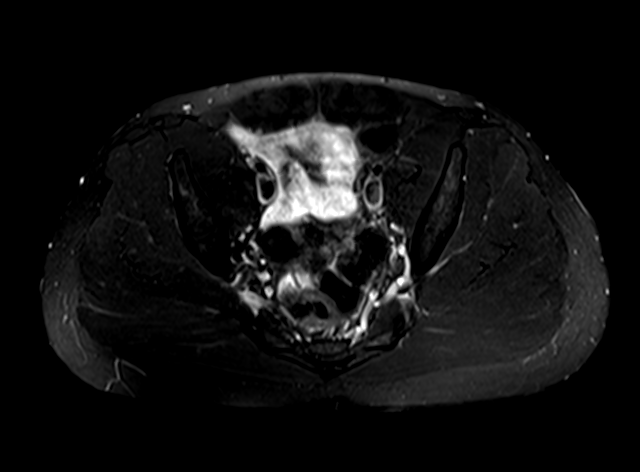

[Series 6: PD fat-sat · sagittal · 4.0mm · 0.70mm/px · 8 of 24 slices shown (1 of 2)]
[im 1/24]
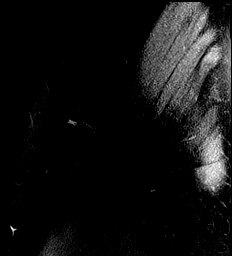
[im 4/24]
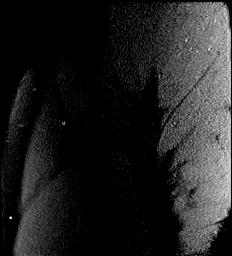
[im 7/24]
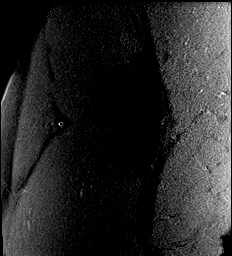
[im 10/24]
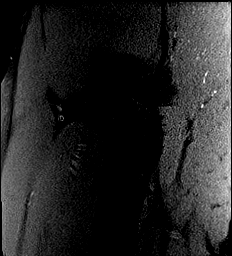
[im 14/24]
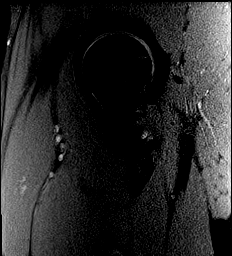
[im 17/24]
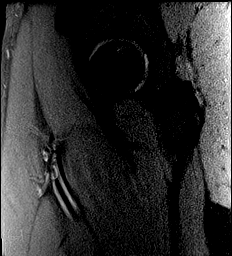
[im 20/24]
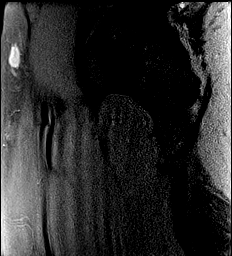
[im 24/24]
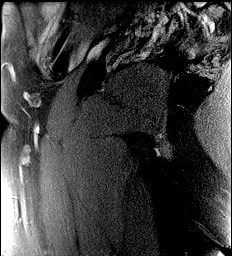

[Series 7: PD fat-sat · coronal · 4.0mm · 0.70mm/px · 5 of 19 slices shown (2 of 2)]
[im 1/19]
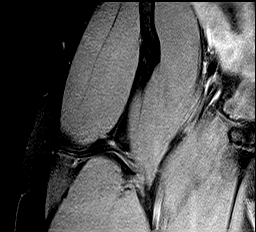
[im 4/19]
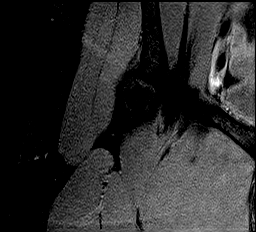
[im 8/19]
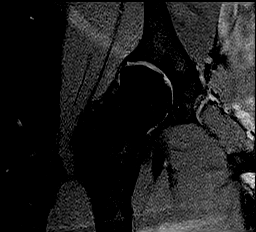
[im 11/19]
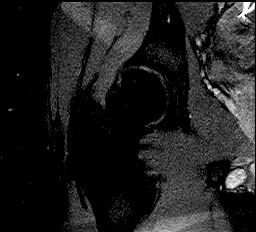
[im 15/19]
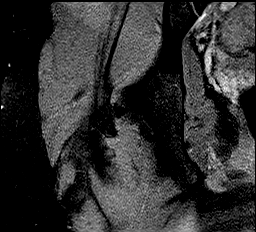

[37 of 40 positions shown; findings below may reference images not displayed]

FINDINGS: Bones:

No hip fracture or dislocation. Dysplastic bilateral femoral heads.
Subchondral serpiginous signal abnormality in the femoral heads
bilaterally consistent with avascular necrosis. No articular surface
collapse or significant surrounding bone marrow edema.

No periosteal reaction or bone destruction. No aggressive osseous
lesion.

Normal sacrum and sacroiliac joints. No SI joint widening or erosive
changes.

Degenerative disease with disc height loss at L4-5.

Articular cartilage and labrum

Articular cartilage: Partial-thickness cartilage loss of the femoral
head and acetabulum bilaterally.

Labrum: Small left anterior labral tear. Degeneration of the right
superior labrum.

Joint or bursal effusion

Joint effusion:  No hip joint effusion.  No SI joint effusion.

Bursae:  No bursa formation.

Muscles and tendons

Flexors: Normal.

Extensors: Normal.

Abductors: Normal.

Adductors: Normal.

Gluteals: Normal.

Hamstrings: Normal.

Other findings

No pelvic free fluid. No fluid collection or hematoma. No inguinal
lymphadenopathy. No inguinal hernia.
IMPRESSION: 1. Dysplastic bilateral femoral heads. Mild osteoarthritis of
bilateral hips.
2. Small left anterior labral tear. Degeneration of the right
superior labrum.
3.  No acute osseous injury of bilateral hips.

## 2021-12-16 IMAGING — MR MR HIP*L* W/O CM
3 series · 36 of 40 positions shown · non-contrast
Comparison: None.

CLINICAL DATA: Bilateral hip pain since [DATE]. No known
injury.

EXAM:
MR OF THE RIGHT HIP WITHOUT CONTRAST
TECHNIQUE: Multiplanar, multisequence MR imaging was performed. No intravenous
contrast was administered.

[Series 3: T2 fat-sat · axial · 4.0mm · 0.62mm/px · z∈[-49,+71]mm · 11 of 26 slices shown]
[im 1/26]
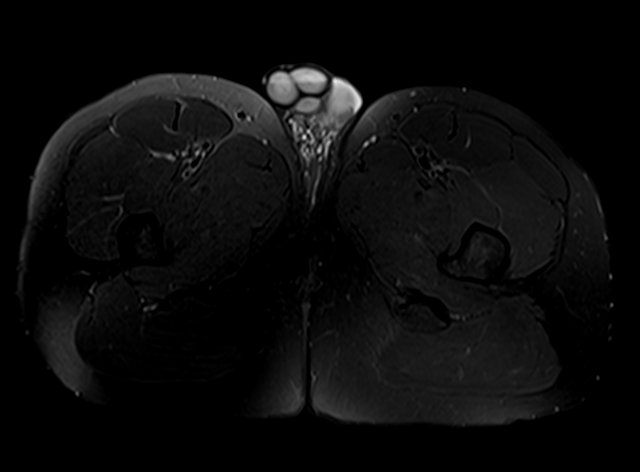
[im 2/26]
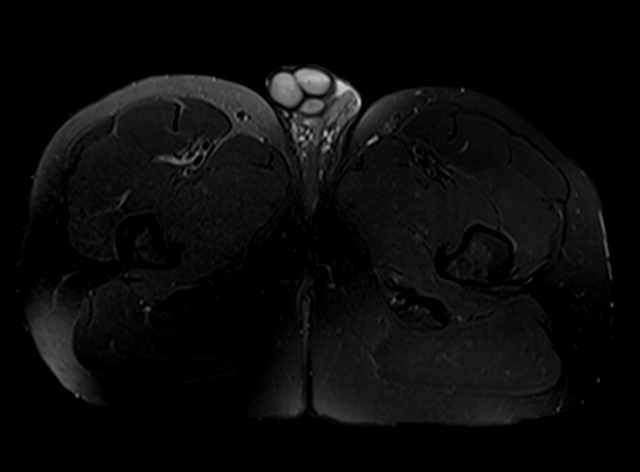
[im 4/26]
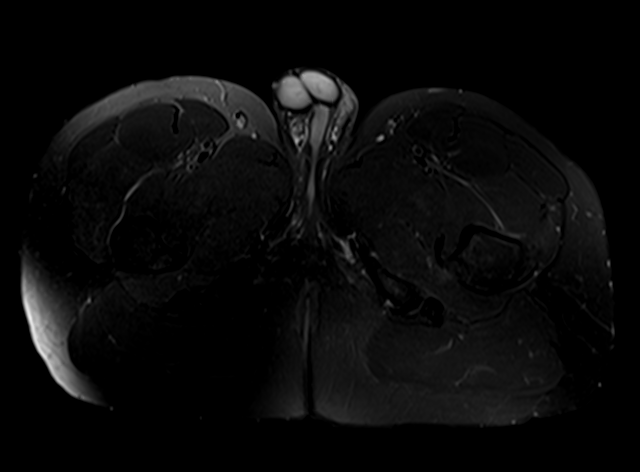
[im 6/26]
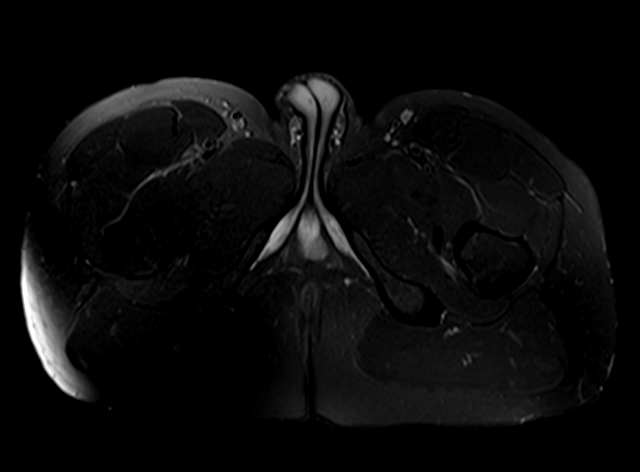
[im 8/26]
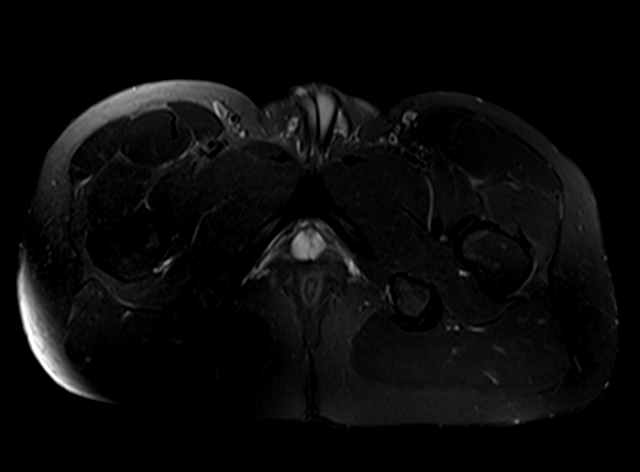
[im 11/26]
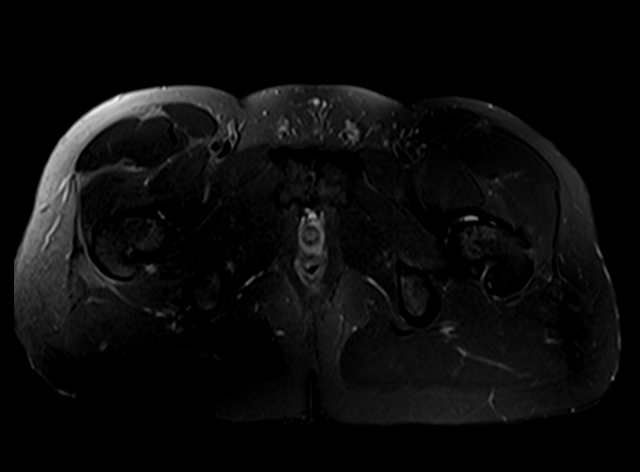
[im 13/26]
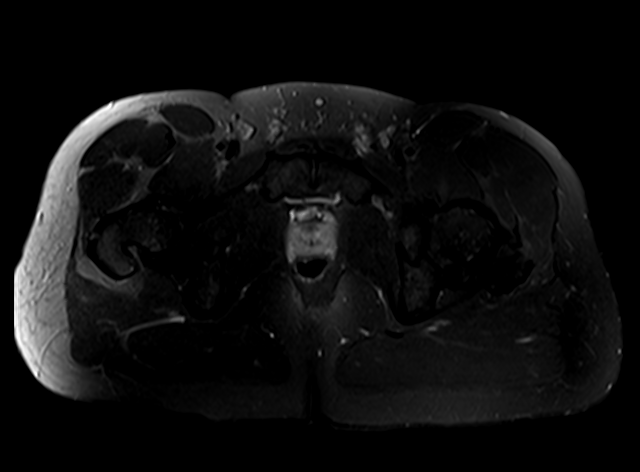
[im 15/26]
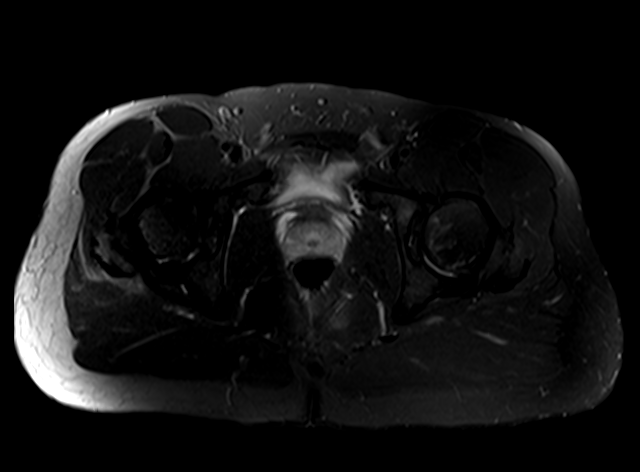
[im 18/26]
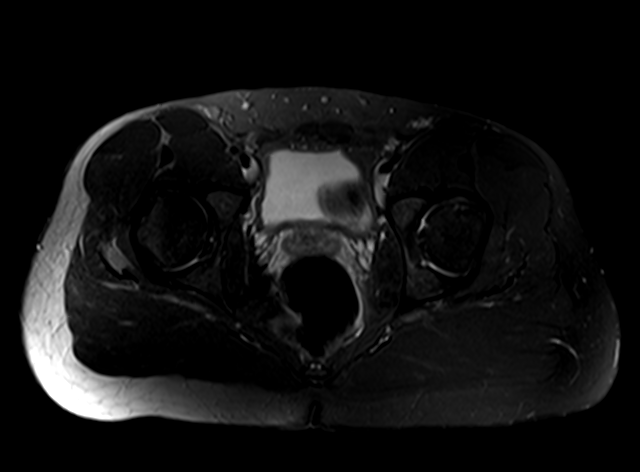
[im 22/26]
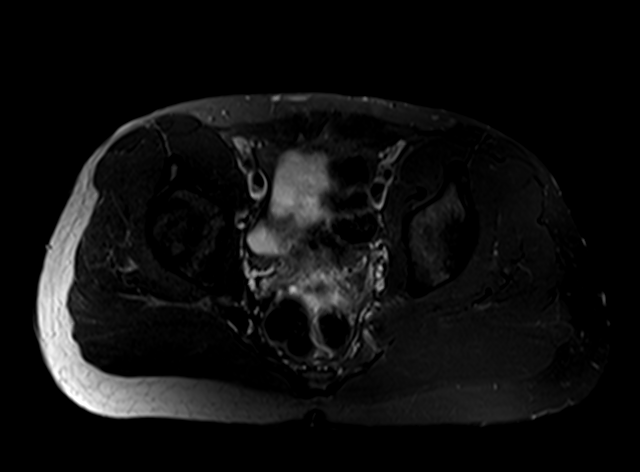
[im 26/26]
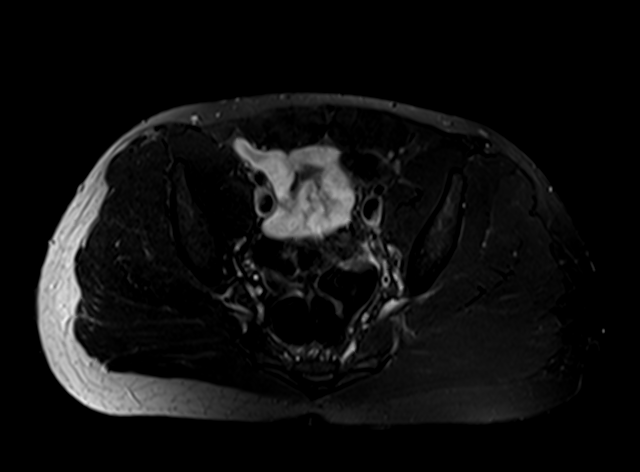

[Series 4: PD fat-sat · coronal · 4.0mm · 0.70mm/px · 11 of 19 slices shown (1 of 2)]
[im 1/19]
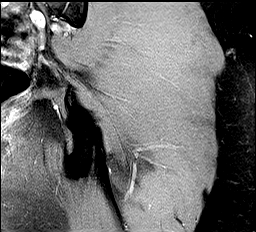
[im 2/19]
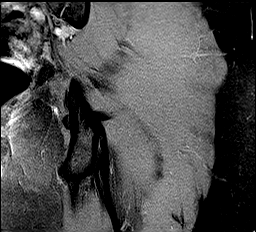
[im 4/19]
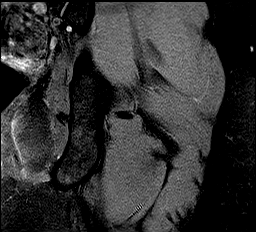
[im 6/19]
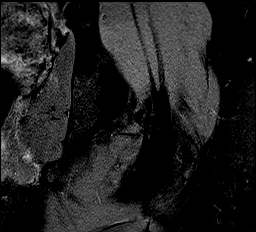
[im 8/19]
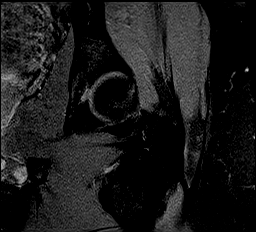
[im 10/19]
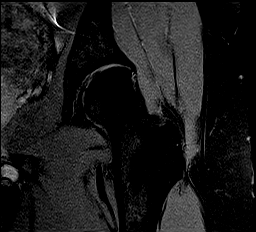
[im 11/19]
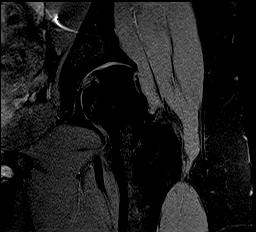
[im 13/19]
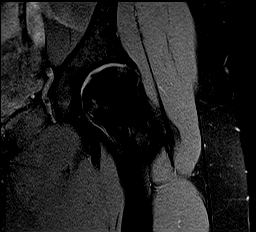
[im 15/19]
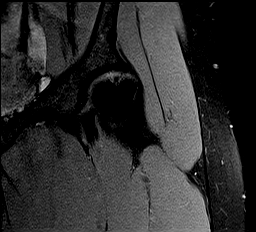
[im 17/19]
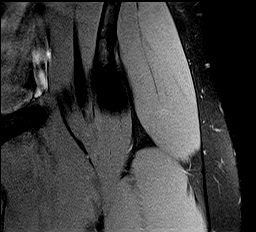
[im 19/19]
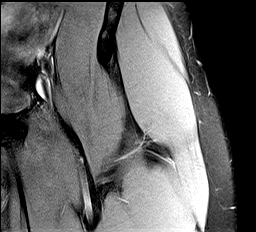

[Series 5: PD fat-sat · sagittal · 4.0mm · 0.70mm/px · 14 of 24 slices shown (2 of 2)]
[im 1/24]
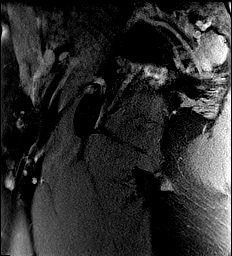
[im 2/24]
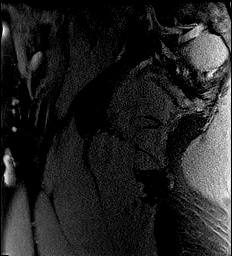
[im 4/24]
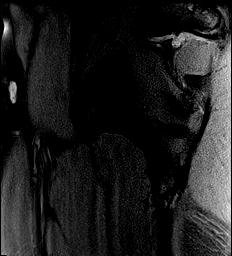
[im 6/24]
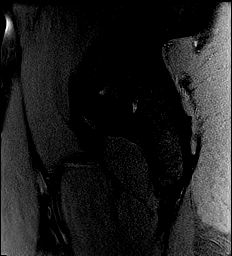
[im 8/24]
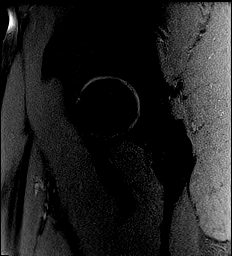
[im 9/24]
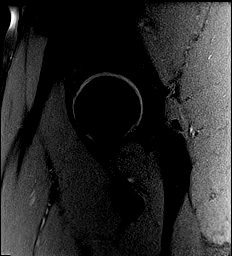
[im 11/24]
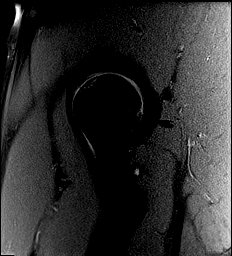
[im 13/24]
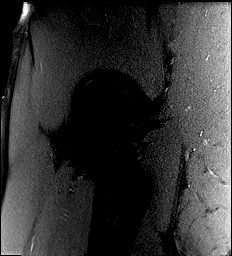
[im 15/24]
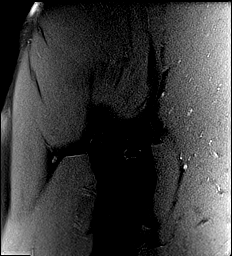
[im 16/24]
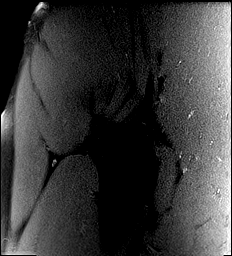
[im 18/24]
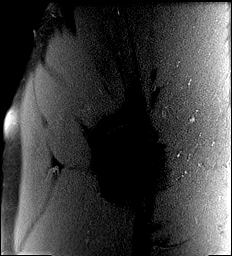
[im 20/24]
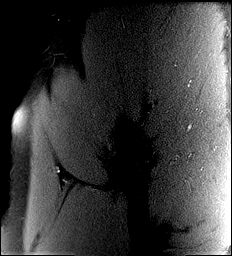
[im 22/24]
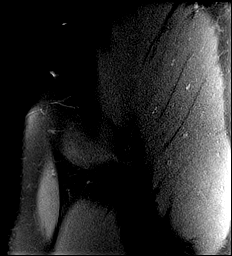
[im 24/24]
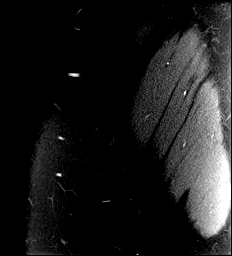

[36 of 40 positions shown; findings below may reference images not displayed]

FINDINGS: Bones:

No hip fracture or dislocation. Dysplastic bilateral femoral heads.
Subchondral serpiginous signal abnormality in the femoral heads
bilaterally consistent with avascular necrosis. No articular surface
collapse or significant surrounding bone marrow edema.

No periosteal reaction or bone destruction. No aggressive osseous
lesion.

Normal sacrum and sacroiliac joints. No SI joint widening or erosive
changes.

Degenerative disease with disc height loss at L4-5.

Articular cartilage and labrum

Articular cartilage: Partial-thickness cartilage loss of the femoral
head and acetabulum bilaterally.

Labrum: Small left anterior labral tear. Degeneration of the right
superior labrum.

Joint or bursal effusion

Joint effusion:  No hip joint effusion.  No SI joint effusion.

Bursae:  No bursa formation.

Muscles and tendons

Flexors: Normal.

Extensors: Normal.

Abductors: Normal.

Adductors: Normal.

Gluteals: Normal.

Hamstrings: Normal.

Other findings

No pelvic free fluid. No fluid collection or hematoma. No inguinal
lymphadenopathy. No inguinal hernia.
IMPRESSION: 1. Dysplastic bilateral femoral heads. Mild osteoarthritis of
bilateral hips.
2. Small left anterior labral tear. Degeneration of the right
superior labrum.
3.  No acute osseous injury of bilateral hips.

## 2021-12-17 ENCOUNTER — Encounter: Payer: Self-pay | Admitting: Adult Health

## 2021-12-18 ENCOUNTER — Telehealth: Payer: Self-pay | Admitting: Rehabilitative and Restorative Service Providers"

## 2021-12-18 ENCOUNTER — Encounter: Admitting: Rehabilitative and Restorative Service Providers"

## 2021-12-18 NOTE — Telephone Encounter (Signed)
No call/no show for appointment today (2nd in a row).  Left message about next appointment time and clinic policy to cancel appointments after that one until Pt returned contact/attended visit.   Last scheduled appointment is on Wednesday.  ? ?Chyrel Masson, PT, DPT, OCS, ATC ?12/18/21  2:05 PM ? ? ?

## 2021-12-19 ENCOUNTER — Telehealth: Payer: Self-pay | Admitting: Adult Health

## 2021-12-19 DIAGNOSIS — M5441 Lumbago with sciatica, right side: Secondary | ICD-10-CM

## 2021-12-19 NOTE — Telephone Encounter (Signed)
Updated patient on MRI results  ? ?MRI showed ? ?1. Dysplastic bilateral femoral heads. Mild osteoarthritis of ?bilateral hips. ?2. Small left anterior labral tear. Degeneration of the right ?superior labrum. ? ?signal abnormality in the femoral heads bilaterally consistent with avascular necrosis ? ?Will refer to Dr. Despina Hick for further management  ?

## 2021-12-20 ENCOUNTER — Encounter: Admitting: Rehabilitative and Restorative Service Providers"

## 2021-12-25 ENCOUNTER — Encounter: Admitting: Rehabilitative and Restorative Service Providers"

## 2021-12-27 ENCOUNTER — Encounter: Admitting: Rehabilitative and Restorative Service Providers"

## 2022-01-01 ENCOUNTER — Encounter: Admitting: Rehabilitative and Restorative Service Providers"

## 2022-01-03 ENCOUNTER — Encounter: Admitting: Rehabilitative and Restorative Service Providers"

## 2022-03-07 ENCOUNTER — Other Ambulatory Visit: Payer: Self-pay | Admitting: Adult Health

## 2022-03-07 DIAGNOSIS — M5441 Lumbago with sciatica, right side: Secondary | ICD-10-CM

## 2022-03-30 ENCOUNTER — Encounter: Admitting: Adult Health

## 2022-03-30 NOTE — Progress Notes (Deleted)
Subjective:    Patient ID: Alex Diaz, male    DOB: 1981/02/26, 41 y.o.   MRN: [id number]  HPI Patient presents for yearly preventative medicine examination. He is a pleasant 41 year old male who  has a past medical history of GERD (gastroesophageal reflux disease) and Headache(784.0).  Anxiety - takes Celexa 20 mg daily. He feels well controlled on this medication   Hyperlipidemia - managed with lipitor 20 mg daily. He denies myalgia or fatigue  Lab Results  Component Value Date   CHOL 180 12/20/2020   HDL 53.90 12/20/2020   LDLCALC 107 (H) 12/20/2020   LDLDIRECT 145.9 04/22/2012   TRIG 97.0 12/20/2020   CHOLHDL 3 12/20/2020    All immunizations and health maintenance protocols were reviewed with the patient and needed orders were placed.  Appropriate screening laboratory values were ordered for the patient including screening of hyperlipidemia, renal function and hepatic function.  Medication reconciliation,  past medical history, social history, problem list and allergies were reviewed in detail with the patient  Goals were established with regard to weight loss, exercise, and  diet in compliance with medications Wt Readings from Last 3 Encounters:  11/24/21 181 lb (82.1 kg)  10/17/21 182 lb (82.6 kg)  12/20/20 176 lb 9.6 oz (80.1 kg)   Review of Systems  Constitutional: Negative.   HENT: Negative.    Eyes: Negative.   Respiratory: Negative.    Cardiovascular: Negative.   Gastrointestinal: Negative.   Endocrine: Negative.   Genitourinary: Negative.   Musculoskeletal: Negative.   Skin: Negative.   Allergic/Immunologic: Negative.   Neurological: Negative.   Hematological: Negative.   Psychiatric/Behavioral: Negative.    All other systems reviewed and are negative.  Past Medical History:  Diagnosis Date   GERD (gastroesophageal reflux disease)    Headache(784.0)     Social History   Socioeconomic History   Marital status: Single    Spouse name:  Not on file   Number of children: Not on file   Years of education: Not on file   Highest education level: Associate degree: occupational, Scientist, product/process development, or vocational program  Occupational History   Not on file  Tobacco Use   Smoking status: Former   Smokeless tobacco: Never  Substance and Sexual Activity   Alcohol use: Yes    Comment: occassionally   Drug use: No   Sexual activity: Yes  Other Topics Concern   Not on file  Social History Narrative   Not on file   Social Determinants of Health   Financial Resource Strain: Low Risk  (11/24/2021)   Overall Financial Resource Strain (CARDIA)    Difficulty of Paying Living Expenses: Not hard at all  Food Insecurity: No Food Insecurity (11/24/2021)   Hunger Vital Sign    Worried About Running Out of Food in the Last Year: Never true    Ran Out of Food in the Last Year: Never true  Transportation Needs: No Transportation Needs (11/24/2021)   PRAPARE - Administrator, Civil Service (Medical): No    Lack of Transportation (Non-Medical): No  Physical Activity: Sufficiently Active (11/24/2021)   Exercise Vital Sign    Days of Exercise per Week: 7 days    Minutes of Exercise per Session: 60 min  Stress: No Stress Concern Present (11/24/2021)   [employer] of Occupational Health - Occupational Stress Questionnaire    Feeling of Stress : Only a little  Social Connections: Moderately Isolated (11/24/2021)   Social Connection and Isolation  Panel [NHANES]    Frequency of Communication with Friends and Family: Twice a week    Frequency of Social Gatherings with Friends and Family: Twice a week    Attends Religious Services: More than 4 times per year    Active Member of [employer] or Organizations: No    Attends Engineer, structural: Not on file    Marital Status: Separated  Intimate Partner Violence: Not on file    No past surgical history on file.  Family History  Problem Relation Age of Onset   Hypertension Father     Kidney disease Father    Diabetes Paternal Aunt    Cancer Maternal Grandmother        lung   Diabetes Maternal Grandfather    Heart disease Paternal Grandmother    Stroke Paternal Grandmother     No Known Allergies  Current Outpatient Medications on File Prior to Visit  Medication Sig Dispense Refill   atorvastatin (LIPITOR) 20 MG tablet Take 1 tablet (20 mg total) by mouth daily. 90 tablet 1   citalopram (CELEXA) 20 MG tablet Take 1 tablet (20 mg total) by mouth daily. 90 tablet 1   No current facility-administered medications on file prior to visit.    There were no vitals taken for this visit.      Objective:   Physical Exam Vitals and nursing note reviewed.  Constitutional:      General: He is not in acute distress.    Appearance: Normal appearance. He is well-developed and normal weight.  HENT:     Head: Normocephalic and atraumatic.     Right Ear: Tympanic membrane, ear canal and external ear normal. There is no impacted cerumen.     Left Ear: Tympanic membrane, ear canal and external ear normal. There is no impacted cerumen.     Nose: Nose normal. No congestion or rhinorrhea.     Mouth/Throat:     Mouth: Mucous membranes are moist.     Pharynx: Oropharynx is clear. No oropharyngeal exudate or posterior oropharyngeal erythema.  Eyes:     General:        Right eye: No discharge.        Left eye: No discharge.     Extraocular Movements: Extraocular movements intact.     Conjunctiva/sclera: Conjunctivae normal.     Pupils: Pupils are equal, round, and reactive to light.  Neck:     Vascular: No carotid bruit.     Trachea: No tracheal deviation.  Cardiovascular:     Rate and Rhythm: Normal rate and regular rhythm.     Pulses: Normal pulses.     Heart sounds: Normal heart sounds. No murmur heard.    No friction rub. No gallop.  Pulmonary:     Effort: Pulmonary effort is normal. No respiratory distress.     Breath sounds: Normal breath sounds. No stridor. No  wheezing, rhonchi or rales.  Chest:     Chest wall: No tenderness.  Abdominal:     General: Bowel sounds are normal. There is no distension.     Palpations: Abdomen is soft. There is no mass.     Tenderness: There is no abdominal tenderness. There is no right CVA tenderness, left CVA tenderness, guarding or rebound.     Hernia: No hernia is present.  Musculoskeletal:        General: No swelling, tenderness, deformity or signs of injury. Normal range of motion.     Right lower leg: No edema.  Left lower leg: No edema.  Lymphadenopathy:     Cervical: No cervical adenopathy.  Skin:    General: Skin is warm and dry.     Capillary Refill: Capillary refill takes less than 2 seconds.     Coloration: Skin is not jaundiced or pale.     Findings: No bruising, erythema, lesion or rash.  Neurological:     General: No focal deficit present.     Mental Status: He is alert and oriented to person, place, and time.     Cranial Nerves: No cranial nerve deficit.     Sensory: No sensory deficit.     Motor: No weakness.     Coordination: Coordination normal.     Gait: Gait normal.     Deep Tendon Reflexes: Reflexes normal.  Psychiatric:        Mood and Affect: Mood normal.        Behavior: Behavior normal.        Thought Content: Thought content normal.        Judgment: Judgment normal.       Assessment & Plan:

## 2022-06-07 ENCOUNTER — Ambulatory Visit: Admitting: Adult Health

## 2022-06-27 ENCOUNTER — Encounter: Admitting: Adult Health

## 2022-07-12 ENCOUNTER — Ambulatory Visit
Admission: EM | Admit: 2022-07-12 | Discharge: 2022-07-12 | Disposition: A | Discharge status: Home / Self Care | Attending: Physician Assistant | Admitting: Physician Assistant

## 2022-07-12 DIAGNOSIS — J069 Acute upper respiratory infection, unspecified: Secondary | ICD-10-CM | POA: Insufficient documentation

## 2022-07-12 DIAGNOSIS — Z1152 Encounter for screening for COVID-19: Secondary | ICD-10-CM | POA: Diagnosis present

## 2022-07-12 LAB — RESP PANEL BY RT-PCR (FLU A&B, COVID) ARPGX2
Influenza A by PCR: NEGATIVE
Influenza B by PCR: NEGATIVE
SARS Coronavirus 2 by RT PCR: NEGATIVE

## 2022-07-12 NOTE — ED Provider Notes (Signed)
[name]    CSN: [id number] Arrival date & time: 07/12/22  1536      History   Chief Complaint Chief Complaint  Patient presents with   Chills   Sore Throat   Generalized Body Aches    HPI Alex Diaz is a 41 y.o. male.   Patient here today for evaluation of chills, sore throat, body aches that started last night.  He reports he has had dry cough.  He denies any nausea, vomiting but has had some diarrhea.  He has not taken any medication for symptoms.  The history is provided by the patient.  Sore Throat Pertinent negatives include no abdominal pain and no shortness of breath.    Past Medical History:  Diagnosis Date   GERD (gastroesophageal reflux disease)    Headache(784.0)     Patient Active Problem List   Diagnosis Date Noted   GERD (gastroesophageal reflux disease) 12/13/2012    History reviewed. No pertinent surgical history.     Home Medications    Prior to Admission medications   Medication Sig Start Date End Date Taking? Authorizing Provider  atorvastatin (LIPITOR) 20 MG tablet Take 1 tablet (20 mg total) by mouth daily. 12/07/21   Nafziger, Tommi Rumps, NP  citalopram (CELEXA) 20 MG tablet Take 1 tablet (20 mg total) by mouth daily. 12/07/21   Nafziger, Tommi Rumps, NP    Family History Family History  Problem Relation Age of Onset   Hypertension Father    Kidney disease Father    Diabetes Paternal Aunt    Cancer Maternal Grandmother        lung   Diabetes Maternal Grandfather    Heart disease Paternal Grandmother    Stroke Paternal Grandmother     Social History Social History   Tobacco Use   Smoking status: Former   Smokeless tobacco: Never  Substance Use Topics   Alcohol use: Yes    Comment: occassionally   Drug use: No     Allergies   Patient has no known allergies.   Review of Systems Review of Systems  Constitutional:  Positive for chills. Negative for fever.  HENT:  Positive for congestion and sore throat.  Negative for ear pain.   Eyes:  Negative for discharge and redness.  Respiratory:  Positive for cough. Negative for shortness of breath.   Gastrointestinal:  Positive for diarrhea. Negative for abdominal pain, nausea and vomiting.  Musculoskeletal:  Positive for myalgias.     Physical Exam Triage Vital Signs ED Triage Vitals  Enc Vitals Group     BP 07/12/22 1637 119/81     Pulse Rate 07/12/22 1637 92     Resp 07/12/22 1637 18     Temp 07/12/22 1637 98.6 F (37 C)     Temp Source 07/12/22 1637 Oral     SpO2 07/12/22 1637 98 %     Weight --      Height --      Head Circumference --      Peak Flow --      Pain Score 07/12/22 1639 6     Pain Loc --      Pain Edu? --      Excl. in [place]? --    No data found.  Updated Vital Signs BP 119/81 (BP Location: Left Arm)   Pulse 92   Temp 98.6 F (37 C) (Oral)   Resp 18   SpO2 98%      Physical Exam Vitals and nursing  note reviewed.  Constitutional:      General: He is not in acute distress.    Appearance: Normal appearance. He is not ill-appearing.  HENT:     Head: Normocephalic and atraumatic.     Nose: Congestion present.     Mouth/Throat:     Mouth: Mucous membranes are moist.     Pharynx: Oropharynx is clear. No oropharyngeal exudate or posterior oropharyngeal erythema.  Eyes:     Conjunctiva/sclera: Conjunctivae normal.  Cardiovascular:     Rate and Rhythm: Normal rate and regular rhythm.     Heart sounds: Normal heart sounds. No murmur heard. Pulmonary:     Effort: Pulmonary effort is normal. No respiratory distress.     Breath sounds: Normal breath sounds. No wheezing, rhonchi or rales.  Skin:    General: Skin is warm and dry.  Neurological:     Mental Status: He is alert.  Psychiatric:        Mood and Affect: Mood normal.        Thought Content: Thought content normal.      UC Treatments / Results  Labs (all labs ordered are listed, but only abnormal results are displayed) Labs Reviewed  RESP PANEL BY  RT-PCR (FLU A&B, COVID) ARPGX2    EKG   Radiology No results found.  Procedures Procedures (including critical care time)  Medications Ordered in UC Medications - No data to display  Initial Impression / Assessment and Plan / UC Course  I have reviewed the triage vital signs and the nursing notes.  Pertinent labs & imaging results that were available during my care of the patient were reviewed by me and considered in my medical decision making (see chart for details).    Suspect viral etiology of symptoms.  Will screen for flu and COVID.  Recommended symptomatic treatment, increase fluids and rest.  Encouraged follow-up if symptoms fail to improve or with any further concerns.  Final Clinical Impressions(s) / UC Diagnoses   Final diagnoses:  Acute upper respiratory infection  Encounter for screening for COVID-19   Discharge Instructions   None    ED Prescriptions   None    PDMP not reviewed this encounter.   Francene Finders, PA-C 07/12/22 [name]

## 2022-07-12 NOTE — ED Triage Notes (Signed)
Pt presents with chills, sore throat, and generalized body aches since last night.

## 2022-07-18 ENCOUNTER — Encounter: Payer: Self-pay | Admitting: Adult Health

## 2022-07-18 ENCOUNTER — Ambulatory Visit: Admitting: Adult Health

## 2022-07-18 ENCOUNTER — Ambulatory Visit (INDEPENDENT_AMBULATORY_CARE_PROVIDER_SITE_OTHER): Admitting: Adult Health

## 2022-07-18 VITALS — BP 110/74 | HR 72 | Temp 98.6°F | Ht 68.0 in | Wt 181.0 lb

## 2022-07-18 DIAGNOSIS — M21952 Unspecified acquired deformity of left thigh: Secondary | ICD-10-CM

## 2022-07-18 DIAGNOSIS — E782 Mixed hyperlipidemia: Secondary | ICD-10-CM

## 2022-07-18 DIAGNOSIS — F419 Anxiety disorder, unspecified: Secondary | ICD-10-CM | POA: Diagnosis not present

## 2022-07-18 DIAGNOSIS — Z0000 Encounter for general adult medical examination without abnormal findings: Secondary | ICD-10-CM | POA: Diagnosis not present

## 2022-07-18 DIAGNOSIS — M21951 Unspecified acquired deformity of right thigh: Secondary | ICD-10-CM

## 2022-07-18 LAB — CBC WITH DIFFERENTIAL/PLATELET
Basophils Absolute: 0 10*3/uL (ref 0.0–0.1)
Basophils Relative: 0.5 % (ref 0.0–3.0)
Eosinophils Absolute: 0.3 10*3/uL (ref 0.0–0.7)
Eosinophils Relative: 3.8 % (ref 0.0–5.0)
HCT: 40.2 % (ref 39.0–52.0)
Hemoglobin: 13.2 g/dL (ref 13.0–17.0)
Lymphocytes Relative: 32.1 % (ref 12.0–46.0)
Lymphs Abs: 2.5 10*3/uL (ref 0.7–4.0)
MCHC: 32.9 g/dL (ref 30.0–36.0)
MCV: 93.7 fl (ref 78.0–100.0)
Monocytes Absolute: 0.7 10*3/uL (ref 0.1–1.0)
Monocytes Relative: 9 % (ref 3.0–12.0)
Neutro Abs: 4.2 10*3/uL (ref 1.4–7.7)
Neutrophils Relative %: 54.6 % (ref 43.0–77.0)
Platelets: 172 10*3/uL (ref 150.0–400.0)
RBC: 4.29 Mil/uL (ref 4.22–5.81)
RDW: 13.8 % (ref 11.5–15.5)
WBC: 7.8 10*3/uL (ref 4.0–10.5)

## 2022-07-18 LAB — COMPREHENSIVE METABOLIC PANEL
ALT: 20 U/L (ref 0–53)
AST: 24 U/L (ref 0–37)
Albumin: 4.4 g/dL (ref 3.5–5.2)
Alkaline Phosphatase: 57 U/L (ref 39–117)
BUN: 12 mg/dL (ref 6–23)
CO2: 31 mEq/L (ref 19–32)
Calcium: 9.6 mg/dL (ref 8.4–10.5)
Chloride: 100 mEq/L (ref 96–112)
Creatinine, Ser: 1.19 mg/dL (ref 0.40–1.50)
GFR: 77.2 mL/min (ref >60.00)
Glucose, Bld: 91 mg/dL (ref 70–99)
Potassium: 4.1 mEq/L (ref 3.5–5.1)
Sodium: 137 mEq/L (ref 135–145)
Total Bilirubin: 0.4 mg/dL (ref 0.2–1.2)
Total Protein: 7.8 g/dL (ref 6.0–8.3)

## 2022-07-18 LAB — LIPID PANEL
Cholesterol: 204 mg/dL — ABNORMAL HIGH (ref 0–200)
HDL: 47.8 mg/dL (ref >39.00)
LDL Cholesterol: 139 mg/dL — ABNORMAL HIGH (ref 0–99)
NonHDL: 156.47
Total CHOL/HDL Ratio: 4
Triglycerides: 87 mg/dL (ref 0.0–149.0)
VLDL: 17.4 mg/dL (ref 0.0–40.0)

## 2022-07-18 LAB — TSH: TSH: 1.34 u[IU]/mL (ref 0.35–5.50)

## 2022-07-18 MED ORDER — CITALOPRAM HYDROBROMIDE 20 MG PO TABS: 20.0000 mg | ORAL_TABLET | Freq: Every day | ORAL | 3 refills | Status: DC

## 2022-07-18 MED ORDER — ATORVASTATIN CALCIUM 20 MG PO TABS: 20.0000 mg | ORAL_TABLET | Freq: Every day | ORAL | 3 refills | Status: DC

## 2022-07-18 NOTE — Patient Instructions (Signed)
It was great seeing you today   We will follow up with you regarding your lab work   Please let me know if you need anything   

## 2022-07-18 NOTE — Progress Notes (Signed)
Subjective:    Patient ID: Alex Diaz, male    DOB: 02-25-1981, 41 y.o.   MRN: [id number]  HPI  Patient presents for yearly preventative medicine examination. He is a pleasant 41 year old male who  has a past medical history of GERD (gastroesophageal reflux disease) and Headache(784.0).  Anxiety - currently managed with Celexa 20 mg daily. Feels well controlled on this medication  Hyperlipidemia - managed with lipitor 20 mg daily. He denies myalgia or fatigue  Lab Results  Component Value Date   CHOL 180 12/20/2020   HDL 53.90 12/20/2020   LDLCALC 107 (H) 12/20/2020   LDLDIRECT 145.9 04/22/2012   TRIG 97.0 12/20/2020   CHOLHDL 3 12/20/2020   Deformity of both hips.- he had MRI's done of his hips back in March 2023 which showed  Dysplastic bilateral femoral heads. Subchondral serpiginous signal abnormality in the femoral heads bilaterally consistent with avascular necrosis He was sent to orthopedics but never went. He denies any hip pain currently.   All immunizations and health maintenance protocols were reviewed with the patient and needed orders were placed.  Appropriate screening laboratory values were ordered for the patient including screening of hyperlipidemia, renal function and hepatic function.  Medication reconciliation,  past medical history, social history, problem list and allergies were reviewed in detail with the patient  Goals were established with regard to weight loss, exercise, and  diet in compliance with medications. Enjoys exercising on  routine basis.  Wt Readings from Last 3 Encounters:  07/18/22 181 lb (82.1 kg)  11/24/21 181 lb (82.1 kg)  10/17/21 182 lb (82.6 kg)   Has no acute complaints   Review of Systems  Constitutional: Negative.   HENT: Negative.    Eyes: Negative.   Respiratory: Negative.    Cardiovascular: Negative.   Gastrointestinal: Negative.   Endocrine: Negative.   Genitourinary: Negative.   Musculoskeletal: Negative.    Skin: Negative.   Allergic/Immunologic: Negative.   Neurological: Negative.   Hematological: Negative.   Psychiatric/Behavioral: Negative.    All other systems reviewed and are negative.  Past Medical History:  Diagnosis Date   GERD (gastroesophageal reflux disease)    Headache(784.0)     Social History   Socioeconomic History   Marital status: Single    Spouse name: Not on file   Number of children: Not on file   Years of education: Not on file   Highest education level: Associate degree: occupational, Hotel manager, or vocational program  Occupational History   Not on file  Tobacco Use   Smoking status: Former   Smokeless tobacco: Never  Substance and Sexual Activity   Alcohol use: Yes    Comment: occassionally   Drug use: No   Sexual activity: Yes  Other Topics Concern   Not on file  Social History Narrative   Not on file   Social Determinants of Health   Financial Resource Strain: Low Risk  (11/24/2021)   Overall Financial Resource Strain (CARDIA)    Difficulty of Paying Living Expenses: Not hard at all  Food Insecurity: No Food Insecurity (11/24/2021)   Hunger Vital Sign    Worried About Running Out of Food in the Last Year: Never true    [place] in the Last Year: Never true  Transportation Needs: No Transportation Needs (11/24/2021)   PRAPARE - Hydrologist (Medical): No    Lack of Transportation (Non-Medical): No  Physical Activity: Sufficiently Active (11/24/2021)   Exercise  Vital Sign    Days of Exercise per Week: 7 days    Minutes of Exercise per Session: 60 min  Stress: No Stress Concern Present (11/24/2021)   [employer] of Occupational Health - Occupational Stress Questionnaire    Feeling of Stress : Only a little  Social Connections: Moderately Isolated (11/24/2021)   Social Connection and Isolation Panel [NHANES]    Frequency of Communication with Friends and Family: Twice a week    Frequency of Social  Gatherings with Friends and Family: Twice a week    Attends Religious Services: More than 4 times per year    Active Member of [employer] or Organizations: No    Attends Banker Meetings: Not on file    Marital Status: Separated  Intimate Partner Violence: Not on file    History reviewed. No pertinent surgical history.  Family History  Problem Relation Age of Onset   Hypertension Father    Kidney disease Father    Diabetes Paternal Aunt    Cancer Maternal Grandmother        lung   Diabetes Maternal Grandfather    Heart disease Paternal Grandmother    Stroke Paternal Grandmother     No Known Allergies  Current Outpatient Medications on File Prior to Visit  Medication Sig Dispense Refill   atorvastatin (LIPITOR) 20 MG tablet Take 1 tablet (20 mg total) by mouth daily. 90 tablet 1   citalopram (CELEXA) 20 MG tablet Take 1 tablet (20 mg total) by mouth daily. 90 tablet 1   No current facility-administered medications on file prior to visit.    BP 110/74   Pulse 72   Temp 98.6 F (37 C) (Oral)   Ht 5\' 8"  (1.727 m)   Wt 181 lb (82.1 kg)   SpO2 99%   BMI 27.52 kg/m       Objective:   Physical Exam Vitals and nursing note reviewed.  Constitutional:      General: He is not in acute distress.    Appearance: Normal appearance. He is well-developed and normal weight.  HENT:     Head: Normocephalic and atraumatic.     Right Ear: Tympanic membrane, ear canal and external ear normal. There is no impacted cerumen.     Left Ear: Tympanic membrane, ear canal and external ear normal. There is no impacted cerumen.     Nose: Nose normal. No congestion or rhinorrhea.     Mouth/Throat:     Mouth: Mucous membranes are moist.     Pharynx: Oropharynx is clear. No oropharyngeal exudate or posterior oropharyngeal erythema.  Eyes:     General:        Right eye: No discharge.        Left eye: No discharge.     Extraocular Movements: Extraocular movements intact.      Conjunctiva/sclera: Conjunctivae normal.     Pupils: Pupils are equal, round, and reactive to light.  Neck:     Vascular: No carotid bruit.     Trachea: No tracheal deviation.  Cardiovascular:     Rate and Rhythm: Normal rate and regular rhythm.     Pulses: Normal pulses.     Heart sounds: Normal heart sounds. No murmur heard.    No friction rub. No gallop.  Pulmonary:     Effort: Pulmonary effort is normal. No respiratory distress.     Breath sounds: Normal breath sounds. No stridor. No wheezing, rhonchi or rales.  Chest:     Chest wall:  No tenderness.  Abdominal:     General: Bowel sounds are normal. There is no distension.     Palpations: Abdomen is soft. There is no mass.     Tenderness: There is no abdominal tenderness. There is no right CVA tenderness, left CVA tenderness, guarding or rebound.     Hernia: No hernia is present.  Musculoskeletal:        General: No swelling, tenderness, deformity or signs of injury. Normal range of motion.     Right lower leg: No edema.     Left lower leg: No edema.  Lymphadenopathy:     Cervical: No cervical adenopathy.  Skin:    General: Skin is warm and dry.     Capillary Refill: Capillary refill takes less than 2 seconds.     Coloration: Skin is not jaundiced or pale.     Findings: No bruising, erythema, lesion or rash.  Neurological:     General: No focal deficit present.     Mental Status: He is alert and oriented to person, place, and time.     Cranial Nerves: No cranial nerve deficit.     Sensory: No sensory deficit.     Motor: No weakness.     Coordination: Coordination normal.     Gait: Gait normal.     Deep Tendon Reflexes: Reflexes normal.  Psychiatric:        Mood and Affect: Mood normal.        Behavior: Behavior normal.        Thought Content: Thought content normal.        Judgment: Judgment normal.       Assessment & Plan:  1. Routine general medical examination at a health care facility - benign exam  - Follow  up in one year or sooner if needed - CBC with Differential/Platelet; Future - Comprehensive metabolic panel; Future - Lipid panel; Future - TSH; Future - TSH - Lipid panel - Comprehensive metabolic panel - CBC with Differential/Platelet  2. Mixed hyperlipidemia  - atorvastatin (LIPITOR) 20 MG tablet; Take 1 tablet (20 mg total) by mouth daily.  Dispense: 90 tablet; Refill: 3 - CBC with Differential/Platelet; Future - Comprehensive metabolic panel; Future - Lipid panel; Future - TSH; Future - TSH - Lipid panel - Comprehensive metabolic panel - CBC with Differential/Platelet  3. Anxiety  - citalopram (CELEXA) 20 MG tablet; Take 1 tablet (20 mg total) by mouth daily.  Dispense: 90 tablet; Refill: 3  4. Acquired deformity of both hips - Will refer back to orthopedics. Explained that due to concern of  avascular necrosis that it would be important to check out - AMB referral to orthopedics  Shirline Frees, NP

## 2022-07-31 ENCOUNTER — Ambulatory Visit (INDEPENDENT_AMBULATORY_CARE_PROVIDER_SITE_OTHER): Admitting: Orthopaedic Surgery

## 2022-07-31 DIAGNOSIS — M87051 Idiopathic aseptic necrosis of right femur: Secondary | ICD-10-CM | POA: Diagnosis not present

## 2022-07-31 DIAGNOSIS — M87052 Idiopathic aseptic necrosis of left femur: Secondary | ICD-10-CM

## 2022-07-31 NOTE — Progress Notes (Signed)
Office Visit Note   Patient: Alex Diaz           Date of Birth: Oct 04, 1981           MRN: [id number] Visit Date: 07/31/2022              Requested by: Dorothyann Peng, NP [place],  [place] [id number] PCP: Dorothyann Peng, NP   Assessment & Plan: Visit Diagnoses:  1. Avascular necrosis of bones of both hips (HCC)     Plan: Impression is bilateral hip avascular necrosis.  I reviewed the MRI which shows small focus of avascular necrosis.  I do not see any collapse or degenerative joint disease.  Currently his symptoms are relatively mild.  Disease process and treatment options were explained.  For now we will just monitor his condition.  He does not need any interventions right now.  He can start with over-the-counter medications for symptomatic relief.  Follow-Up Instructions: No follow-ups on file.   Orders:  No orders of the defined types were placed in this encounter.  No orders of the defined types were placed in this encounter.     Procedures: No procedures performed   Clinical Data: No additional findings.   Subjective: Chief Complaint  Patient presents with   Left Hip - Pain   Right Hip - Pain    HPI Alex Diaz is a 41 year old gentleman who comes in for evaluation of bilateral hip AVN that was recently found on MRI.  The MRI was done in March.  He has had pain that waxes and wanes.  He is mainly only symptomatic when he has to walk long distances.  Denies any alleviators or aggravators.  Denies any radicular symptoms.  Not taking any medications.  Review of Systems  Constitutional: Negative.   All other systems reviewed and are negative.    Objective: Vital Signs: There were no vitals taken for this visit.  Physical Exam Vitals and nursing note reviewed.  Constitutional:      Appearance: He is well-developed.  HENT:     Head: Normocephalic and atraumatic.  Eyes:     Pupils: Pupils are equal, round, and reactive to light.   Pulmonary:     Effort: Pulmonary effort is normal.  Abdominal:     Palpations: Abdomen is soft.  Musculoskeletal:        General: Normal range of motion.     Cervical back: Neck supple.  Skin:    General: Skin is warm.  Neurological:     Mental Status: He is alert and oriented to person, place, and time.  Psychiatric:        Behavior: Behavior normal.        Thought Content: Thought content normal.        Judgment: Judgment normal.     Ortho Exam Examination of bilateral hips show fluid painless range of motion.  Normal gait and ambulation.  No sciatic tension signs.  No tenderness palpation. Specialty Comments:  No specialty comments available.  Imaging: No results found.   PMFS History: Patient Active Problem List   Diagnosis Date Noted   Avascular necrosis of bones of both hips ([place]) 07/31/2022   GERD (gastroesophageal reflux disease) 12/13/2012   Past Medical History:  Diagnosis Date   GERD (gastroesophageal reflux disease)    Headache(784.0)     Family History  Problem Relation Age of Onset   Hypertension Father    Kidney disease Father    Diabetes Paternal Aunt  Cancer Maternal Grandmother        lung   Diabetes Maternal Grandfather    Heart disease Paternal Grandmother    Stroke Paternal Grandmother     No past surgical history on file. Social History   Occupational History   Not on file  Tobacco Use   Smoking status: Former   Smokeless tobacco: Never  Substance and Sexual Activity   Alcohol use: Yes    Comment: occassionally   Drug use: No   Sexual activity: Yes

## 2022-09-16 ENCOUNTER — Encounter: Payer: Self-pay | Admitting: Emergency Medicine

## 2022-09-16 ENCOUNTER — Ambulatory Visit
Admission: EM | Admit: 2022-09-16 | Discharge: 2022-09-16 | Disposition: A | Discharge status: Home / Self Care | Attending: Physician Assistant | Admitting: Physician Assistant

## 2022-09-16 DIAGNOSIS — M545 Low back pain, unspecified: Secondary | ICD-10-CM

## 2022-09-16 MED ORDER — CYCLOBENZAPRINE HCL 10 MG PO TABS: 10.0000 mg | ORAL_TABLET | Freq: Two times a day (BID) | ORAL | 0 refills | Status: DC | PRN

## 2022-09-16 MED ORDER — PREDNISONE 20 MG PO TABS: 40.0000 mg | ORAL_TABLET | Freq: Every day | ORAL | 0 refills | Status: AC

## 2022-09-16 NOTE — ED Triage Notes (Signed)
Pt was lifting weights in the garage on Friday and now feels like he has pulled something in his right lower back down into his hamstring.

## 2022-09-16 NOTE — ED Provider Notes (Signed)
[name]    CSN: [id number] Arrival date & time: 09/16/22  0904      History   Chief Complaint Chief Complaint  Patient presents with   Leg Pain    HPI Alex Diaz is a 41 y.o. male.   Patient here today for evaluation of right-sided low back pain that radiates into his right upper posterior leg.  He reports that he has had similar in the past.  He notes that symptoms started after he was doing heavy lifting in his garage.  He denies any numbness or tingling.  He has not had any weakness.  He denies any bowel or bladder dysfunction.  He has tried over-the-counter remedies without resolution.  The history is provided by the patient.  Leg Pain Associated symptoms: back pain   Associated symptoms: no fever     Past Medical History:  Diagnosis Date   GERD (gastroesophageal reflux disease)    Headache(784.0)     Patient Active Problem List   Diagnosis Date Noted   Avascular necrosis of bones of both hips (HCC) 07/31/2022   GERD (gastroesophageal reflux disease) 12/13/2012    History reviewed. No pertinent surgical history.     Home Medications    Prior to Admission medications   Medication Sig Start Date End Date Taking? Authorizing Provider  cyclobenzaprine (FLEXERIL) 10 MG tablet Take 1 tablet (10 mg total) by mouth 2 (two) times daily as needed for muscle spasms. 09/16/22  Yes Tomi Bamberger, PA-C  predniSONE (DELTASONE) 20 MG tablet Take 2 tablets (40 mg total) by mouth daily with breakfast for 5 days. 09/16/22 09/21/22 Yes Tomi Bamberger, PA-C  atorvastatin (LIPITOR) 20 MG tablet Take 1 tablet (20 mg total) by mouth daily. 07/18/22   Nafziger, Kandee Keen, NP  citalopram (CELEXA) 20 MG tablet Take 1 tablet (20 mg total) by mouth daily. 07/18/22   Nafziger, Kandee Keen, NP    Family History Family History  Problem Relation Age of Onset   Hypertension Father    Kidney disease Father    Diabetes Paternal Aunt    Cancer Maternal Grandmother         lung   Diabetes Maternal Grandfather    Heart disease Paternal Grandmother    Stroke Paternal Grandmother     Social History Social History   Tobacco Use   Smoking status: Former   Smokeless tobacco: Never  Substance Use Topics   Alcohol use: Yes    Comment: occassionally   Drug use: No     Allergies   Patient has no known allergies.   Review of Systems Review of Systems  Constitutional:  Negative for chills and fever.  Eyes:  Negative for discharge and redness.  Musculoskeletal:  Positive for back pain and myalgias.  Neurological:  Negative for numbness.     Physical Exam Triage Vital Signs ED Triage Vitals  Enc Vitals Group     BP 09/16/22 0925 114/66     Pulse Rate 09/16/22 0925 77     Resp 09/16/22 0925 16     Temp 09/16/22 0925 98.6 F (37 C)     Temp Source 09/16/22 0925 Oral     SpO2 09/16/22 0925 98 %     Weight --      Height --      Head Circumference --      Peak Flow --      Pain Score 09/16/22 0924 7     Pain Loc --  Pain Edu? --      Excl. in GC? --    No data found.  Updated Vital Signs BP 114/66 (BP Location: Left Arm)   Pulse 77   Temp 98.6 F (37 C) (Oral)   Resp 16   SpO2 98%      Physical Exam Vitals and nursing note reviewed.  Constitutional:      General: He is not in acute distress.    Appearance: Normal appearance. He is not ill-appearing.  HENT:     Head: Normocephalic and atraumatic.  Eyes:     Conjunctiva/sclera: Conjunctivae normal.  Cardiovascular:     Rate and Rhythm: Normal rate.  Pulmonary:     Effort: Pulmonary effort is normal. No respiratory distress.  Musculoskeletal:     Comments: No tenderness palpation noted to midline thoracic or lumbar spine.  Mild tenderness to palpation noted to right lower back.  Neurological:     Mental Status: He is alert.  Psychiatric:        Mood and Affect: Mood normal.        Behavior: Behavior normal.        Thought Content: Thought content normal.      UC  Treatments / Results  Labs (all labs ordered are listed, but only abnormal results are displayed) Labs Reviewed - No data to display  EKG   Radiology No results found.  Procedures Procedures (including critical care time)  Medications Ordered in UC Medications - No data to display  Initial Impression / Assessment and Plan / UC Course  I have reviewed the triage vital signs and the nursing notes.  Pertinent labs & imaging results that were available during my care of the patient were reviewed by me and considered in my medical decision making (see chart for details).    Suspect muscle strain and will treat with muscle relaxer and steroid burst.  Recommended follow-up if no gradual improvement with any further concerns.  Patient expresses understanding.  Final Clinical Impressions(s) / UC Diagnoses   Final diagnoses:  Acute right-sided low back pain without sciatica   Discharge Instructions   None    ED Prescriptions     Medication Sig Dispense Auth. Provider   cyclobenzaprine (FLEXERIL) 10 MG tablet Take 1 tablet (10 mg total) by mouth 2 (two) times daily as needed for muscle spasms. 20 tablet Erma Pinto F, PA-C   predniSONE (DELTASONE) 20 MG tablet Take 2 tablets (40 mg total) by mouth daily with breakfast for 5 days. 10 tablet Tomi Bamberger, PA-C      PDMP not reviewed this encounter.   Tomi Bamberger, PA-C 09/16/22 1327

## 2022-09-25 ENCOUNTER — Ambulatory Visit: Admission: EM | Admit: 2022-09-25 | Discharge: 2022-09-25 | Disposition: A | Discharge status: Home / Self Care

## 2022-09-25 DIAGNOSIS — M545 Low back pain, unspecified: Secondary | ICD-10-CM

## 2022-09-25 NOTE — ED Provider Notes (Signed)
[name]    CSN: [id number] Arrival date & time: 09/25/22  1526      History   Chief Complaint Chief Complaint  Patient presents with   Back Pain    HPI Alex Diaz is a 41 y.o. male.   Patient here today for evaluation of continued right-sided low back pain.  He reports that symptoms have improved somewhat since he was seen last week.  He reports he was able to work last night has just been taking it relatively easy.  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  He denies any numbness or tingling.  He states that icing has helped his back significantly.  The history is provided by the patient.  Back Pain Associated symptoms: no fever and no numbness     Past Medical History:  Diagnosis Date   GERD (gastroesophageal reflux disease)    Headache(784.0)     Patient Active Problem List   Diagnosis Date Noted   Avascular necrosis of bones of both hips (HCC) 07/31/2022   GERD (gastroesophageal reflux disease) 12/13/2012    History reviewed. No pertinent surgical history.     Home Medications    Prior to Admission medications   Medication Sig Start Date End Date Taking? Authorizing Provider  atorvastatin (LIPITOR) 20 MG tablet Take 1 tablet (20 mg total) by mouth daily. 07/18/22   Nafziger, Kandee Keen, NP  citalopram (CELEXA) 20 MG tablet Take 1 tablet (20 mg total) by mouth daily. 07/18/22   Nafziger, Kandee Keen, NP  cyclobenzaprine (FLEXERIL) 10 MG tablet Take 1 tablet (10 mg total) by mouth 2 (two) times daily as needed for muscle spasms. 09/16/22   Tomi Bamberger, PA-C    Family History Family History  Problem Relation Age of Onset   Hypertension Father    Kidney disease Father    Diabetes Paternal Aunt    Cancer Maternal Grandmother        lung   Diabetes Maternal Grandfather    Heart disease Paternal Grandmother    Stroke Paternal Grandmother     Social History Social History   Tobacco Use   Smoking status: Former    Smokeless tobacco: Never  Substance Use Topics   Alcohol use: Yes    Comment: occassionally   Drug use: No     Allergies   Patient has no known allergies.   Review of Systems Review of Systems  Constitutional:  Negative for chills and fever.  Eyes:  Negative for discharge and redness.  Musculoskeletal:  Positive for back pain and myalgias.  Neurological:  Negative for numbness.     Physical Exam Triage Vital Signs ED Triage Vitals  Enc Vitals Group     BP 09/25/22 1605 126/84     Pulse Rate 09/25/22 1605 85     Resp 09/25/22 1605 16     Temp 09/25/22 1605 98 F (36.7 C)     Temp src --      SpO2 09/25/22 1605 98 %     Weight --      Height --      Head Circumference --      Peak Flow --      Pain Score 09/25/22 1529 6     Pain Loc --      Pain Edu? --      Excl. in GC? --    No data found.  Updated Vital Signs BP 126/84   Pulse 85  Temp 98 F (36.7 C)   Resp 16   SpO2 98%      Physical Exam Vitals and nursing note reviewed.  Constitutional:      General: He is not in acute distress.    Appearance: Normal appearance. He is not ill-appearing.  HENT:     Head: Normocephalic and atraumatic.     Nose: No congestion or rhinorrhea.  Eyes:     Conjunctiva/sclera: Conjunctivae normal.  Cardiovascular:     Rate and Rhythm: Normal rate.  Pulmonary:     Effort: Pulmonary effort is normal. No respiratory distress.  Musculoskeletal:     Comments: No TTP to midline thoracic and lumbar spine. No TTP to low back diffusely  Neurological:     Mental Status: He is alert.  Psychiatric:        Mood and Affect: Mood normal.        Behavior: Behavior normal.        Thought Content: Thought content normal.      UC Treatments / Results  Labs (all labs ordered are listed, but only abnormal results are displayed) Labs Reviewed - No data to display  EKG   Radiology No results found.  Procedures Procedures (including critical care time)  Medications  Ordered in UC Medications - No data to display  Initial Impression / Assessment and Plan / UC Course  I have reviewed the triage vital signs and the nursing notes.  Pertinent labs & imaging results that were available during my care of the patient were reviewed by me and considered in my medical decision making (see chart for details).    Recommended continued symptomatic management if needed and follow up if symptoms do not continue to improve or worsen in any way.   Final Clinical Impressions(s) / UC Diagnoses   Final diagnoses:  Acute right-sided low back pain without sciatica   Discharge Instructions   None    ED Prescriptions   None    PDMP not reviewed this encounter.   Tomi Bamberger, PA-C 09/25/22 1649

## 2022-09-25 NOTE — ED Triage Notes (Signed)
Pt presents with ongoing right side lower back pain.

## 2023-02-26 ENCOUNTER — Ambulatory Visit: Admitting: Adult Health

## 2023-02-27 ENCOUNTER — Ambulatory Visit: Admitting: Adult Health

## 2023-08-22 ENCOUNTER — Ambulatory Visit: Admitting: Adult Health

## 2023-08-22 ENCOUNTER — Ambulatory Visit

## 2023-08-22 VITALS — BP 100/70 | HR 107 | Temp 98.2°F

## 2023-08-22 DIAGNOSIS — M79671 Pain in right foot: Secondary | ICD-10-CM

## 2023-08-22 DIAGNOSIS — F419 Anxiety disorder, unspecified: Secondary | ICD-10-CM | POA: Diagnosis not present

## 2023-08-22 LAB — CBC
HCT: 44.8 % (ref 39.0–52.0)
Hemoglobin: 14.7 g/dL (ref 13.0–17.0)
MCHC: 32.9 g/dL (ref 30.0–36.0)
MCV: 94.9 fL (ref 78.0–100.0)
Platelets: 290 10*3/uL (ref 150.0–400.0)
RBC: 4.72 Mil/uL (ref 4.22–5.81)
RDW: 13.8 % (ref 11.5–15.5)
WBC: 10.4 10*3/uL (ref 4.0–10.5)

## 2023-08-22 LAB — URIC ACID: Uric Acid, Serum: 11.2 mg/dL — ABNORMAL HIGH (ref 4.0–7.8)

## 2023-08-22 MED ORDER — CITALOPRAM HYDROBROMIDE 40 MG PO TABS: 40.0000 mg | ORAL_TABLET | Freq: Every day | ORAL | 3 refills | Status: DC

## 2023-08-22 MED ORDER — INDOMETHACIN 50 MG PO CAPS: 50.0000 mg | ORAL_CAPSULE | Freq: Three times a day (TID) | ORAL | 0 refills | Status: AC

## 2023-08-22 NOTE — Progress Notes (Signed)
Subjective:    Patient ID: Alex Diaz, male    DOB: 22-Feb-1981, 42 y.o.   MRN: [id number]  HPI  42 year old male who  has a past medical history of GERD (gastroesophageal reflux disease) and Headache(784.0).  He presents to the office today for three days or right great toe pain, swelling, and redness. Pain is described as throbbing and intense. He cannot bear weight on his right foot. He denies trauma or injury. He believes this happened two years ago.   Anxiety - he feels like his anxiety is slightly worse. He would like to increase his Celexa. Denie sdepressive symptoms. Cannot pin point why anxiety is worse.   Review of Systems See HPI   Past Medical History:  Diagnosis Date   GERD (gastroesophageal reflux disease)    Headache(784.0)     Social History   Socioeconomic History   Marital status: Married    Spouse name: Not on file   Number of children: Not on file   Years of education: Not on file   Highest education level: Associate degree: academic program  Occupational History   Not on file  Tobacco Use   Smoking status: Former   Smokeless tobacco: Never  Substance and Sexual Activity   Alcohol use: Yes    Comment: occassionally   Drug use: No   Sexual activity: Yes  Other Topics Concern   Not on file  Social History Narrative   Not on file   Social Determinants of Health   Financial Resource Strain: Low Risk  (08/22/2023)   Overall Financial Resource Strain (CARDIA)    Difficulty of Paying Living Expenses: Not hard at all  Food Insecurity: No Food Insecurity (08/22/2023)   Hunger Vital Sign    Worried About Running Out of Food in the Last Year: Never true    Ran Out of Food in the Last Year: Never true  Transportation Needs: No Transportation Needs (08/22/2023)   PRAPARE - Administrator, Civil Service (Medical): No    Lack of Transportation (Non-Medical): No  Physical Activity: Insufficiently Active (08/22/2023)   Exercise Vital  Sign    Days of Exercise per Week: 3 days    Minutes of Exercise per Session: 40 min  Stress: Stress Concern Present (08/22/2023)   [employer] of Occupational Health - Occupational Stress Questionnaire    Feeling of Stress : To some extent  Social Connections: Moderately Isolated (08/22/2023)   Social Connection and Isolation Panel [NHANES]    Frequency of Communication with Friends and Family: Three times a week    Frequency of Social Gatherings with Friends and Family: Once a week    Attends Religious Services: 1 to 4 times per year    Active Member of [employer] or Organizations: No    Attends Engineer, structural: Not on file    Marital Status: Separated  Intimate Partner Violence: Unknown (01/12/2022)   Received from [employer], [facility]   HITS    Physically Hurt: Not on file    Insult or Talk Down To: Not on file    Threaten Physical Harm: Not on file    Scream or Curse: Not on file    No past surgical history on file.  Family History  Problem Relation Age of Onset   Hypertension Father    Kidney disease Father    Diabetes Paternal Aunt    Cancer Maternal Grandmother        lung  Diabetes Maternal Grandfather    Heart disease Paternal Grandmother    Stroke Paternal Grandmother     No Known Allergies  Current Outpatient Medications on File Prior to Visit  Medication Sig Dispense Refill   atorvastatin (LIPITOR) 20 MG tablet Take 1 tablet (20 mg total) by mouth daily. 90 tablet 3   cyclobenzaprine (FLEXERIL) 10 MG tablet Take 1 tablet (10 mg total) by mouth 2 (two) times daily as needed for muscle spasms. 20 tablet 0   No current facility-administered medications on file prior to visit.    BP 100/70   Pulse (!) 107   Temp 98.2 F (36.8 C)   SpO2 95%       Objective:   Physical Exam Vitals and nursing note reviewed.  Constitutional:      Appearance: Normal appearance.  Musculoskeletal:        General: Swelling and tenderness present.  Normal range of motion.     Comments: Redness, warmth, swelling and pain to right MTP joint of great toe   Skin:    General: Skin is warm and dry.     Findings: Erythema present.  Neurological:     General: No focal deficit present.     Mental Status: He is alert and oriented to person, place, and time.  Psychiatric:        Mood and Affect: Mood normal.        Thought Content: Thought content normal.        Judgment: Judgment normal.       Assessment & Plan:  1. Right foot pain - Likely gout - Uric Acid; Future - DG Foot Complete Right; Future - CBC; Future - indomethacin (INDOCIN) 50 MG capsule; Take 1 capsule (50 mg total) by mouth 3 (three) times daily with meals for 10 days.  Dispense: 30 capsule; Refill: 0  2. Anxiety - Will increase celexa to 40 mg  - citalopram (CELEXA) 40 MG tablet; Take 1 tablet (40 mg total) by mouth daily.  Dispense: 90 tablet; Refill: 3 - Follow up if not controlled.

## 2023-08-23 ENCOUNTER — Encounter: Payer: Self-pay | Admitting: Adult Health

## 2023-09-04 ENCOUNTER — Encounter: Admitting: Adult Health

## 2023-09-17 ENCOUNTER — Encounter: Admitting: Adult Health

## 2023-09-17 NOTE — Progress Notes (Unsigned)
Subjective:    Patient ID: Alex Diaz, male    DOB: 14-Jul-1981, 42 y.o.   MRN: [id number]  HPI Patient presents for yearly preventative medicine examination. He is a pleasant 42 year old male who  has a past medical history of GERD (gastroesophageal reflux disease), Gout, and Headache(784.0).  Anxiety - currently managed with Celexa 40 mg daily. Feels well controlled on this medication  Hyperlipidemia - managed with lipitor 20 mg daily. He denies myalgia or fatigue  Lab Results  Component Value Date   CHOL 204 (H) 07/18/2022   HDL 47.80 07/18/2022   LDLCALC 139 (H) 07/18/2022   LDLDIRECT 145.9 04/22/2012   TRIG 87.0 07/18/2022   CHOLHDL 4 07/18/2022   Gout - most recent flare in 08/2023. He had gone multiple years without a flare. 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   All immunizations and health maintenance protocols were reviewed with the patient and needed orders were placed.  Appropriate screening laboratory values were ordered for the patient including screening of hyperlipidemia, renal function and hepatic function. If indicated by BPH, a PSA was ordered.  Medication reconciliation,  past medical history, social history, problem list and allergies were reviewed in detail with the patient  Goals were established with regard to weight loss, exercise, and  diet in compliance with medications  Review of Systems  Constitutional: Negative.   HENT: Negative.    Eyes: Negative.   Respiratory: Negative.    Cardiovascular: Negative.   Gastrointestinal: Negative.   Endocrine: Negative.   Genitourinary: Negative.   Musculoskeletal: Negative.   Skin: Negative.   Allergic/Immunologic: Negative.   Neurological: Negative.   Hematological: Negative.   Psychiatric/Behavioral: Negative.    All other systems reviewed and are negative.  Past Medical History:  Diagnosis Date   GERD (gastroesophageal reflux disease)    Gout    Headache(784.0)     Social History    Socioeconomic History   Marital status: Married    Spouse name: Not on file   Number of children: Not on file   Years of education: Not on file   Highest education level: Associate degree: academic program  Occupational History   Not on file  Tobacco Use   Smoking status: Former   Smokeless tobacco: Never  Substance and Sexual Activity   Alcohol use: Yes    Comment: occassionally   Drug use: No   Sexual activity: Yes  Other Topics Concern   Not on file  Social History Narrative   Not on file   Social Determinants of Health   Financial Resource Strain: Low Risk  (08/22/2023)   Overall Financial Resource Strain (CARDIA)    Difficulty of Paying Living Expenses: Not hard at all  Food Insecurity: No Food Insecurity (08/22/2023)   Hunger Vital Sign    Worried About Running Out of Food in the Last Year: Never true    Ran Out of Food in the Last Year: Never true  Transportation Needs: No Transportation Needs (08/22/2023)   PRAPARE - Administrator, Civil Service (Medical): No    Lack of Transportation (Non-Medical): No  Physical Activity: Insufficiently Active (08/22/2023)   Exercise Vital Sign    Days of Exercise per Week: 3 days    Minutes of Exercise per Session: 40 min  Stress: Stress Concern Present (08/22/2023)   [employer] of Occupational Health - Occupational Stress Questionnaire    Feeling of Stress : To some extent  Social Connections: Moderately Isolated (08/22/2023)  Social Connection and Isolation Panel [NHANES]    Frequency of Communication with Friends and Family: Three times a week    Frequency of Social Gatherings with Friends and Family: Once a week    Attends Religious Services: 1 to 4 times per year    Active Member of [employer] or Organizations: No    Attends Engineer, structural: Not on file    Marital Status: Separated  Intimate Partner Violence: Unknown (01/12/2022)   Received from [employer], [facility]   HITS     Physically Hurt: Not on file    Insult or Talk Down To: Not on file    Threaten Physical Harm: Not on file    Scream or Curse: Not on file    No past surgical history on file.  Family History  Problem Relation Age of Onset   Hypertension Father    Kidney disease Father    Diabetes Paternal Aunt    Cancer Maternal Grandmother        lung   Diabetes Maternal Grandfather    Heart disease Paternal Grandmother    Stroke Paternal Grandmother     No Known Allergies  Current Outpatient Medications on File Prior to Visit  Medication Sig Dispense Refill   atorvastatin (LIPITOR) 20 MG tablet Take 1 tablet (20 mg total) by mouth daily. 90 tablet 3   citalopram (CELEXA) 40 MG tablet Take 1 tablet (40 mg total) by mouth daily. 90 tablet 3   cyclobenzaprine (FLEXERIL) 10 MG tablet Take 1 tablet (10 mg total) by mouth 2 (two) times daily as needed for muscle spasms. 20 tablet 0   No current facility-administered medications on file prior to visit.    There were no vitals taken for this visit.      Objective:   Physical Exam Vitals and nursing note reviewed.  Constitutional:      General: He is not in acute distress.    Appearance: Normal appearance. He is not ill-appearing.  HENT:     Head: Normocephalic and atraumatic.     Right Ear: Tympanic membrane, ear canal and external ear normal. There is no impacted cerumen.     Left Ear: Tympanic membrane, ear canal and external ear normal. There is no impacted cerumen.     Nose: Nose normal. No congestion or rhinorrhea.     Mouth/Throat:     Mouth: Mucous membranes are moist.     Pharynx: Oropharynx is clear.  Eyes:     Extraocular Movements: Extraocular movements intact.     Conjunctiva/sclera: Conjunctivae normal.     Pupils: Pupils are equal, round, and reactive to light.  Neck:     Vascular: No carotid bruit.  Cardiovascular:     Rate and Rhythm: Normal rate and regular rhythm.     Pulses: Normal pulses.     Heart sounds: No  murmur heard.    No friction rub. No gallop.  Pulmonary:     Effort: Pulmonary effort is normal.     Breath sounds: Normal breath sounds.  Abdominal:     General: Abdomen is flat. Bowel sounds are normal. There is no distension.     Palpations: Abdomen is soft. There is no mass.     Tenderness: There is no abdominal tenderness. There is no guarding or rebound.     Hernia: No hernia is present.  Musculoskeletal:        General: Normal range of motion.     Cervical back: Normal range of  motion and neck supple.  Lymphadenopathy:     Cervical: No cervical adenopathy.  Skin:    General: Skin is warm and dry.     Capillary Refill: Capillary refill takes less than 2 seconds.  Neurological:     General: No focal deficit present.     Mental Status: He is alert and oriented to person, place, and time.  Psychiatric:        Mood and Affect: Mood normal.        Behavior: Behavior normal.        Thought Content: Thought content normal.        Judgment: Judgment normal.           Assessment & Plan:

## 2023-10-23 ENCOUNTER — Encounter: Payer: Self-pay | Admitting: Adult Health

## 2023-10-23 ENCOUNTER — Ambulatory Visit: Admitting: Adult Health

## 2023-10-23 VITALS — BP 120/80 | HR 67 | Temp 98.4°F | Ht 67.25 in | Wt 194.0 lb

## 2023-10-23 DIAGNOSIS — F419 Anxiety disorder, unspecified: Secondary | ICD-10-CM

## 2023-10-23 DIAGNOSIS — Z0000 Encounter for general adult medical examination without abnormal findings: Secondary | ICD-10-CM

## 2023-10-23 DIAGNOSIS — E782 Mixed hyperlipidemia: Secondary | ICD-10-CM | POA: Diagnosis not present

## 2023-10-23 LAB — COMPREHENSIVE METABOLIC PANEL
ALT: 27 U/L (ref 0–53)
AST: 28 U/L (ref 0–37)
Albumin: 5 g/dL (ref 3.5–5.2)
Alkaline Phosphatase: 52 U/L (ref 39–117)
BUN: 15 mg/dL (ref 6–23)
CO2: 30 meq/L (ref 19–32)
Calcium: 9.9 mg/dL (ref 8.4–10.5)
Chloride: 98 meq/L (ref 96–112)
Creatinine, Ser: 1.37 mg/dL (ref 0.40–1.50)
GFR: 64.62 mL/min (ref >60.00)
Glucose, Bld: 95 mg/dL (ref 70–99)
Potassium: 5.1 meq/L (ref 3.5–5.1)
Sodium: 137 meq/L (ref 135–145)
Total Bilirubin: 0.5 mg/dL (ref 0.2–1.2)
Total Protein: 8.2 g/dL (ref 6.0–8.3)

## 2023-10-23 LAB — CBC
HCT: 44.3 % (ref 39.0–52.0)
Hemoglobin: 14.4 g/dL (ref 13.0–17.0)
MCHC: 32.5 g/dL (ref 30.0–36.0)
MCV: 94.6 fL (ref 78.0–100.0)
Platelets: 186 10*3/uL (ref 150.0–400.0)
RBC: 4.68 Mil/uL (ref 4.22–5.81)
RDW: 14 % (ref 11.5–15.5)
WBC: 7.7 10*3/uL (ref 4.0–10.5)

## 2023-10-23 LAB — LIPID PANEL
Cholesterol: 249 mg/dL — ABNORMAL HIGH (ref 0–200)
HDL: 65.2 mg/dL (ref >39.00)
LDL Cholesterol: 137 mg/dL — ABNORMAL HIGH (ref 0–99)
NonHDL: 184.15
Total CHOL/HDL Ratio: 4
Triglycerides: 238 mg/dL — ABNORMAL HIGH (ref 0.0–149.0)
VLDL: 47.6 mg/dL — ABNORMAL HIGH (ref 0.0–40.0)

## 2023-10-23 LAB — TSH: TSH: 1.68 u[IU]/mL (ref 0.35–5.50)

## 2023-10-23 NOTE — Progress Notes (Signed)
 Subjective:    Patient ID: Alex Diaz, male    DOB: October 13, 1980, 43 y.o.   MRN: [id number]  HPI  Patient presents for yearly preventative medicine examination. He is a pleasant 43 year old male who  has a past medical history of GERD (gastroesophageal reflux disease), Gout, and Headache(784.0).  Anxiety - currently managed with Celexa  40 mg daily. Feels well controlled on this medication  Hyperlipidemia - managed with lipitor 20 mg daily. He denies myalgia or fatigue  Lab Results  Component Value Date   CHOL 204 (H) 07/18/2022   HDL 47.80 07/18/2022   LDLCALC 139 (H) 07/18/2022   LDLDIRECT 145.9 04/22/2012   TRIG 87.0 07/18/2022   CHOLHDL 4 07/18/2022   All immunizations and health maintenance protocols were reviewed with the patient and needed orders were placed.  Appropriate screening laboratory values were ordered for the patient including screening of hyperlipidemia, renal function and hepatic function.  Medication reconciliation,  past medical history, social history, problem list and allergies were reviewed in detail with the patient  Goals were established with regard to weight loss, exercise, and  diet in compliance with medications Wt Readings from Last 3 Encounters:  10/23/23 194 lb (88 kg)  07/18/22 181 lb (82.1 kg)  11/24/21 181 lb (82.1 kg)   Review of Systems  Constitutional: Negative.   HENT: Negative.    Eyes: Negative.   Respiratory: Negative.    Cardiovascular: Negative.   Gastrointestinal: Negative.   Endocrine: Negative.   Genitourinary: Negative.   Musculoskeletal: Negative.   Skin: Negative.   Allergic/Immunologic: Negative.   Neurological: Negative.   Hematological: Negative.   Psychiatric/Behavioral: Negative.    All other systems reviewed and are negative.  Past Medical History:  Diagnosis Date   GERD (gastroesophageal reflux disease)    Gout    Headache(784.0)     Social History   Socioeconomic History   Marital status:  Married    Spouse name: Not on file   Number of children: Not on file   Years of education: Not on file   Highest education level: Associate degree: academic program  Occupational History   Not on file  Tobacco Use   Smoking status: Former   Smokeless tobacco: Never  Substance and Sexual Activity   Alcohol use: Yes    Comment: occassionally   Drug use: No   Sexual activity: Yes  Other Topics Concern   Not on file  Social History Narrative   Not on file   Social Drivers of Health   Financial Resource Strain: Low Risk  (08/22/2023)   Overall Financial Resource Strain (CARDIA)    Difficulty of Paying Living Expenses: Not hard at all  Food Insecurity: No Food Insecurity (08/22/2023)   Hunger Vital Sign    Worried About Running Out of Food in the Last Year: Never true    Ran Out of Food in the Last Year: Never true  Transportation Needs: No Transportation Needs (08/22/2023)   PRAPARE - Administrator, Civil Service (Medical): No    Lack of Transportation (Non-Medical): No  Physical Activity: Insufficiently Active (08/22/2023)   Exercise Vital Sign    Days of Exercise per Week: 3 days    Minutes of Exercise per Session: 40 min  Stress: Stress Concern Present (08/22/2023)   [employer] of Occupational Health - Occupational Stress Questionnaire    Feeling of Stress : To some extent  Social Connections: Moderately Isolated (08/22/2023)   Social Connection and Isolation  Panel [NHANES]    Frequency of Communication with Friends and Family: Three times a week    Frequency of Social Gatherings with Friends and Family: Once a week    Attends Religious Services: 1 to 4 times per year    Active Member of [employer] or Organizations: No    Attends Engineer, structural: Not on file    Marital Status: Separated  Intimate Partner Violence: Unknown (01/12/2022)   Received from [employer], [facility]   HITS    Physically Hurt: Not on file    Insult or Talk Down  To: Not on file    Threaten Physical Harm: Not on file    Scream or Curse: Not on file    History reviewed. No pertinent surgical history.  Family History  Problem Relation Age of Onset   Hypertension Father    Kidney disease Father    Diabetes Paternal Aunt    Cancer Maternal Grandmother        lung   Diabetes Maternal Grandfather    Heart disease Paternal Grandmother    Stroke Paternal Grandmother     No Known Allergies  Current Outpatient Medications on File Prior to Visit  Medication Sig Dispense Refill   atorvastatin  (LIPITOR) 20 MG tablet Take 1 tablet (20 mg total) by mouth daily. 90 tablet 3   citalopram  (CELEXA ) 40 MG tablet Take 1 tablet (40 mg total) by mouth daily. 90 tablet 3   cyclobenzaprine  (FLEXERIL ) 10 MG tablet Take 1 tablet (10 mg total) by mouth 2 (two) times daily as needed for muscle spasms. 20 tablet 0   No current facility-administered medications on file prior to visit.    BP 120/80   Pulse 67   Temp 98.4 F (36.9 C) (Oral)   Ht 5' 7.25" (1.708 m)   Wt 194 lb (88 kg)   SpO2 95%   BMI 30.16 kg/m       Objective:   Physical Exam Vitals and nursing note reviewed.  Constitutional:      General: He is not in acute distress.    Appearance: Normal appearance. He is not ill-appearing.  HENT:     Head: Normocephalic and atraumatic.     Right Ear: Tympanic membrane, ear canal and external ear normal. There is no impacted cerumen.     Left Ear: Tympanic membrane, ear canal and external ear normal. There is no impacted cerumen.     Nose: Nose normal. No congestion or rhinorrhea.     Mouth/Throat:     Mouth: Mucous membranes are moist.     Pharynx: Oropharynx is clear.  Eyes:     Extraocular Movements: Extraocular movements intact.     Conjunctiva/sclera: Conjunctivae normal.     Pupils: Pupils are equal, round, and reactive to light.  Neck:     Vascular: No carotid bruit.  Cardiovascular:     Rate and Rhythm: Normal rate and regular rhythm.      Pulses: Normal pulses.     Heart sounds: No murmur heard.    No friction rub. No gallop.  Pulmonary:     Effort: Pulmonary effort is normal.     Breath sounds: Normal breath sounds.  Abdominal:     General: Abdomen is flat. Bowel sounds are normal. There is no distension.     Palpations: Abdomen is soft. There is no mass.     Tenderness: There is no abdominal tenderness. There is no guarding or rebound.     Hernia: No  hernia is present.  Musculoskeletal:        General: Normal range of motion.     Cervical back: Normal range of motion and neck supple.  Lymphadenopathy:     Cervical: No cervical adenopathy.  Skin:    General: Skin is warm and dry.     Capillary Refill: Capillary refill takes less than 2 seconds.  Neurological:     General: No focal deficit present.     Mental Status: He is alert and oriented to person, place, and time.  Psychiatric:        Mood and Affect: Mood normal.        Behavior: Behavior normal.        Thought Content: Thought content normal.        Judgment: Judgment normal.       Assessment & Plan:  1. Routine general medical examination at a health care facility (Primary) Today patient counseled on age appropriate routine health concerns for screening and prevention, each reviewed and up to date or declined. Immunizations reviewed and up to date or declined. Labs ordered and reviewed. Risk factors for depression reviewed and negative. Hearing function and visual acuity are intact. ADLs screened and addressed as needed. Functional ability and level of safety reviewed and appropriate. Education, counseling and referrals performed based on assessed risks today. Patient provided with a copy of personalized plan for preventive services. - Continue to exercise and eat healthy  - Follow up in one year or sooner if needed  2. Mixed hyperlipidemia - Consider increase in statin  - Lipid panel; Future - TSH; Future - CBC; Future - Comprehensive metabolic  panel; Future  3. Anxiety - Well controlled. No change in medication     10/23/2023    1:25 PM  GAD 7 : Generalized Anxiety Score  Nervous, Anxious, on Edge 0  Control/stop worrying 0  Worry too much - different things 0  Trouble relaxing 0  Restless 0  Easily annoyed or irritable 0  Afraid - awful might happen 0  Total GAD 7 Score 0  Anxiety Difficulty Not difficult at all   - Lipid panel; Future - TSH; Future - CBC; Future - Comprehensive metabolic panel; Future  Alto Atta, NP

## 2023-10-29 ENCOUNTER — Telehealth: Payer: Self-pay

## 2023-10-30 NOTE — Telephone Encounter (Signed)
 Entered in error

## 2023-12-10 ENCOUNTER — Encounter: Payer: Self-pay | Admitting: Adult Health

## 2023-12-11 NOTE — Telephone Encounter (Signed)
**Note De-identified  [place] Obfuscation** Please advise 

## 2024-01-31 ENCOUNTER — Encounter: Payer: Self-pay | Admitting: Adult Health

## 2024-02-07 ENCOUNTER — Ambulatory Visit: Payer: Self-pay | Admitting: Adult Health

## 2024-02-07 VITALS — BP 120/82 | HR 80 | Temp 98.2°F | Ht 67.5 in | Wt 199.0 lb

## 2024-02-07 DIAGNOSIS — Z1330 Encounter for screening examination for mental health and behavioral disorders, unspecified: Secondary | ICD-10-CM | POA: Diagnosis not present

## 2024-02-07 DIAGNOSIS — E782 Mixed hyperlipidemia: Secondary | ICD-10-CM

## 2024-02-07 MED ORDER — ATORVASTATIN CALCIUM 20 MG PO TABS: 20.0000 mg | ORAL_TABLET | Freq: Every day | ORAL | 3 refills | Status: AC

## 2024-02-07 NOTE — Progress Notes (Unsigned)
 Subjective:    Patient ID: Alex Diaz, male    DOB: [birth year], 43 y.o.   MRN: [id number]  HPI 43 year old male who  has a past medical history of GERD (gastroesophageal reflux disease), Gout, and Headache(784.0).  He presents to the office today in need of a mental health evaluation and also an alcohol risk evaluation for the court system.  He reports that assessments are for custody of his child.  He does take Celexa  40 mg for  anxiety and has felt well-controlled on this medication for quite some time.  As for alcohol consumption, reports that he may have 1 drink a night if that.   Review of Systems See HPI   Past Medical History:  Diagnosis Date   GERD (gastroesophageal reflux disease)    Gout    Headache(784.0)     Social History   Socioeconomic History   Marital status: Married    Spouse name: Not on file   Number of children: Not on file   Years of education: Not on file   Highest education level: Associate degree: academic program  Occupational History   Not on file  Tobacco Use   Smoking status: Former   Smokeless tobacco: Never  Substance and Sexual Activity   Alcohol use: Yes    Comment: occassionally   Drug use: No   Sexual activity: Yes  Other Topics Concern   Not on file  Social History Narrative   Not on file   Social Drivers of Health   Financial Resource Strain: Low Risk  (08/22/2023)   Overall Financial Resource Strain (CARDIA)    Difficulty of Paying Living Expenses: Not hard at all  Food Insecurity: No Food Insecurity (08/22/2023)   Hunger Vital Sign    Worried About Running Out of Food in the Last Year: Never true    Ran Out of Food in the Last Year: Never true  Transportation Needs: No Transportation Needs (08/22/2023)   PRAPARE - Administrator, Civil Service (Medical): No    Lack of Transportation (Non-Medical): No  Physical Activity: Insufficiently Active (08/22/2023)   Exercise Vital Sign    Days of Exercise  per Week: 3 days    Minutes of Exercise per Session: 40 min  Stress: Stress Concern Present (08/22/2023)   [employer] of Occupational Health - Occupational Stress Questionnaire    Feeling of Stress : To some extent  Social Connections: Moderately Isolated (08/22/2023)   Social Connection and Isolation Panel [NHANES]    Frequency of Communication with Friends and Family: Three times a week    Frequency of Social Gatherings with Friends and Family: Once a week    Attends Religious Services: 1 to 4 times per year    Active Member of [employer] or Organizations: No    Attends Engineer, structural: Not on file    Marital Status: Separated  Intimate Partner Violence: Unknown (01/12/2022)   Received from [facility], [facility]   HITS    Physically Hurt: Not on file    Insult or Talk Down To: Not on file    Threaten Physical Harm: Not on file    Scream or Curse: Not on file    No past surgical history on file.  Family History  Problem Relation Age of Onset   Hypertension Father    Kidney disease Father    Diabetes Paternal Aunt    Cancer Maternal Grandmother        lung  Diabetes Maternal Grandfather    Heart disease Paternal Grandmother    Stroke Paternal Grandmother     No Known Allergies  Current Outpatient Medications on File Prior to Visit  Medication Sig Dispense Refill   atorvastatin  (LIPITOR) 20 MG tablet Take 1 tablet (20 mg total) by mouth daily. 90 tablet 3   citalopram  (CELEXA ) 40 MG tablet Take 1 tablet (40 mg total) by mouth daily. 90 tablet 3   No current facility-administered medications on file prior to visit.    BP 120/82   Pulse 80   Temp 98.2 F (36.8 C) (Oral)   Ht 5' 7.5" (1.715 m)   Wt 199 lb (90.3 kg)   SpO2 98%   BMI 30.71 kg/m       Objective:   Physical Exam Vitals and nursing note reviewed.  Constitutional:      Appearance: Normal appearance.  Cardiovascular:     Rate and Rhythm: Normal rate and regular rhythm.      Pulses: Normal pulses.     Heart sounds: Normal heart sounds.  Pulmonary:     Effort: Pulmonary effort is normal.     Breath sounds: Normal breath sounds.  Musculoskeletal:        General: Normal range of motion.  Skin:    General: Skin is warm and dry.  Neurological:     General: No focal deficit present.     Mental Status: He is alert and oriented to person, place, and time.  Psychiatric:        Mood and Affect: Mood normal.        Behavior: Behavior normal.        Thought Content: Thought content normal.        Judgment: Judgment normal.       Assessment & Plan:  1. Encounter for behavioral health screening (Primary) - Letter written, I feel as though he is mentally fit to have custody of his child.  His PHQ-9, GAD-7, and audit scores were all within normal limits and do not show concern for mental health problem  or alcohol use disorder Flowsheet Row Office Visit from 02/07/2024 in [facility] [place]  PHQ-9 Total Score 3         02/07/2024    4:04 PM 10/23/2023    1:25 PM  GAD 7 : Generalized Anxiety Score  Nervous, Anxious, on Edge 1 0  Control/stop worrying 0 0  Worry too much - different things 0 0  Trouble relaxing 0 0  Restless 0 0  Easily annoyed or irritable 0 0  Afraid - awful might happen 0 0  Total GAD 7 Score 1 0  Anxiety Difficulty  Not difficult at all    Flowsheet Row Office Visit from 02/07/2024 in [facility] [place]  Alcohol Use Disorder Identification Test Final Score (AUDIT) 3        2. Mixed hyperlipidemia - needs refill.  - atorvastatin  (LIPITOR) 20 MG tablet; Take 1 tablet (20 mg total) by mouth daily.  Dispense: 90 tablet; Refill: 3  Lennox Dolberry, NP

## 2024-07-07 ENCOUNTER — Encounter: Payer: Self-pay | Admitting: Adult Health

## 2024-10-17 ENCOUNTER — Other Ambulatory Visit: Payer: Self-pay | Admitting: Adult Health

## 2024-10-17 DIAGNOSIS — F419 Anxiety disorder, unspecified: Secondary | ICD-10-CM
